# Patient Record
Sex: Male | Born: 1960
Health system: Southern US, Community
[De-identification: ages and names within clinical notes are randomized; demographics above are authoritative.]

## PROBLEM LIST (undated history)

## (undated) DIAGNOSIS — E785 Hyperlipidemia, unspecified: Secondary | ICD-10-CM

## (undated) DIAGNOSIS — F419 Anxiety disorder, unspecified: Secondary | ICD-10-CM

## (undated) DIAGNOSIS — F410 Panic disorder [episodic paroxysmal anxiety] without agoraphobia: Secondary | ICD-10-CM

## (undated) DIAGNOSIS — N529 Male erectile dysfunction, unspecified: Secondary | ICD-10-CM

## (undated) DIAGNOSIS — K219 Gastro-esophageal reflux disease without esophagitis: Secondary | ICD-10-CM

## (undated) DIAGNOSIS — G4700 Insomnia, unspecified: Secondary | ICD-10-CM

## (undated) DIAGNOSIS — F329 Major depressive disorder, single episode, unspecified: Secondary | ICD-10-CM

## (undated) DIAGNOSIS — F32A Depression, unspecified: Secondary | ICD-10-CM

## (undated) DIAGNOSIS — J302 Other seasonal allergic rhinitis: Secondary | ICD-10-CM

## (undated) DIAGNOSIS — I10 Essential (primary) hypertension: Secondary | ICD-10-CM

## (undated) HISTORY — PX: HERNIA REPAIR: SHX51

## (undated) HISTORY — PX: COLONOSCOPY: SHX174

---

## 2011-12-13 DIAGNOSIS — J309 Allergic rhinitis, unspecified: Secondary | ICD-10-CM | POA: Insufficient documentation

## 2011-12-13 DIAGNOSIS — E785 Hyperlipidemia, unspecified: Secondary | ICD-10-CM | POA: Insufficient documentation

## 2011-12-13 DIAGNOSIS — N529 Male erectile dysfunction, unspecified: Secondary | ICD-10-CM | POA: Insufficient documentation

## 2011-12-31 ENCOUNTER — Encounter (HOSPITAL_COMMUNITY): Payer: Self-pay | Admitting: *Deleted

## 2011-12-31 ENCOUNTER — Emergency Department (HOSPITAL_COMMUNITY): Payer: Medicare Other

## 2011-12-31 ENCOUNTER — Emergency Department (HOSPITAL_COMMUNITY)
Admission: EM | Admit: 2011-12-31 | Discharge: 2011-12-31 | Disposition: A | Discharge status: Home / Self Care | Payer: Medicare Other | Attending: Emergency Medicine | Admitting: Emergency Medicine

## 2011-12-31 DIAGNOSIS — R220 Localized swelling, mass and lump, head: Secondary | ICD-10-CM | POA: Insufficient documentation

## 2011-12-31 DIAGNOSIS — H9209 Otalgia, unspecified ear: Secondary | ICD-10-CM | POA: Insufficient documentation

## 2011-12-31 DIAGNOSIS — K089 Disorder of teeth and supporting structures, unspecified: Secondary | ICD-10-CM | POA: Insufficient documentation

## 2011-12-31 DIAGNOSIS — R51 Headache: Secondary | ICD-10-CM | POA: Insufficient documentation

## 2011-12-31 HISTORY — DX: Essential (primary) hypertension: I10

## 2011-12-31 HISTORY — DX: Depression, unspecified: F32.A

## 2011-12-31 HISTORY — DX: Insomnia, unspecified: G47.00

## 2011-12-31 HISTORY — DX: Gastro-esophageal reflux disease without esophagitis: K21.9

## 2011-12-31 HISTORY — DX: Anxiety disorder, unspecified: F41.9

## 2011-12-31 HISTORY — DX: Major depressive disorder, single episode, unspecified: F32.9

## 2011-12-31 HISTORY — DX: Hyperlipidemia, unspecified: E78.5

## 2011-12-31 HISTORY — DX: Panic disorder (episodic paroxysmal anxiety): F41.0

## 2011-12-31 HISTORY — DX: Male erectile dysfunction, unspecified: N52.9

## 2011-12-31 HISTORY — DX: Other seasonal allergic rhinitis: J30.2

## 2011-12-31 MED ORDER — SODIUM CHLORIDE 0.9 % IV SOLN: Freq: Once | INTRAVENOUS | Status: DC

## 2011-12-31 MED ORDER — ONDANSETRON 8 MG PO TBDP
8.0000 mg | ORAL_TABLET | Freq: Once | ORAL | Status: AC
  Administered 2011-12-31: 8 mg via ORAL
  Filled 2011-12-31: qty 1

## 2011-12-31 MED ORDER — OXYCODONE-ACETAMINOPHEN 5-325 MG PO TABS
2.0000 | ORAL_TABLET | Freq: Once | ORAL | Status: AC
  Administered 2011-12-31: 2 via ORAL
  Filled 2011-12-31: qty 2

## 2011-12-31 MED ORDER — OXYCODONE-ACETAMINOPHEN 5-325 MG PO TABS: 1.0000 | ORAL_TABLET | ORAL | Status: AC | PRN

## 2011-12-31 MED ORDER — IBUPROFEN 800 MG PO TABS: 800.0000 mg | ORAL_TABLET | Freq: Three times a day (TID) | ORAL | Status: AC

## 2011-12-31 MED ORDER — IOHEXOL 300 MG/ML  SOLN
100.0000 mL | Freq: Once | INTRAMUSCULAR | Status: AC | PRN
  Administered 2011-12-31: 100 mL via INTRAVENOUS

## 2011-12-31 NOTE — ED Provider Notes (Signed)
History     CSN: [id number]  Arrival date & time 12/31/11  1501   First MD Initiated Contact with Patient 12/31/11 1616      Chief Complaint  Patient presents with  . Facial Swelling  . Otalgia  . Dental Pain    (Consider location/radiation/quality/duration/timing/severity/associated sxs/prior treatment) HPI Comments: [name] [name] 51 y.o. male   The chief complaint is: Patient presents with:   Facial Swelling   Otalgia   Dental Pain     Patient has had ear pain in the right ear for the past few weeks and associated sinu pressure.  He was diagnosed 2 days ago with effusion by his PCP.  Over the past 2 days he had developed acute severe pain in his jaw area anterior to the ear and extending down into the right neck and pain in the temple.   He has not been eating well due to pain.  He He has pain when closing his jaw tightly.  He  Denies tooth pain, sore throat, difficulty breath or swallowing. he rates pain 9/10.  He has no recent history of infection.  He denies heat swelling or redness.  He denies drainage from his cheek.     Patient is a 51 y.o. male presenting with ear pain and tooth pain. The history is provided by the patient and a friend. No language interpreter was used.  Otalgia Associated symptoms include neck pain. Pertinent negatives include no ear discharge, no headaches, no rhinorrhea, no abdominal pain, no diarrhea, no vomiting and no cough.  Dental PainPrimary symptoms do not include headaches, fever, shortness of breath or cough.  Additional symptoms include: facial swelling and ear pain. Additional symptoms do not include: trouble swallowing and fatigue.    Past Medical History  Diagnosis Date  . Hypertension   . Hyperlipidemia   . GERD (gastroesophageal reflux disease)   . Seasonal allergies   . Depression   . Anxiety   . Panic   . Insomnia   . Erectile dysfunction     Past Surgical History  Procedure Date  . Hernia repair     History  reviewed. No pertinent family history.  History  Substance Use Topics  . Smoking status: Never Smoker   . Smokeless tobacco: Never Used  . Alcohol Use: Yes      Review of Systems  Constitutional: Negative for fever, chills and fatigue.  HENT: Positive for ear pain, facial swelling, neck pain and sinus pressure. Negative for congestion, rhinorrhea, mouth sores, trouble swallowing, neck stiffness, dental problem and ear discharge.   Eyes: Negative for pain and discharge.  Respiratory: Negative for cough and shortness of breath.   Cardiovascular: Negative for chest pain.  Gastrointestinal: Negative for nausea, vomiting, abdominal pain and diarrhea.  Musculoskeletal: Negative for myalgias.  Neurological: Negative for headaches.    Allergies  Review of patient's allergies indicates no known allergies.  Home Medications   Current Outpatient Rx  Name Route Sig Dispense Refill  . ALPRAZOLAM 0.25 MG PO TABS Oral Take 0.25 mg by mouth 3 (three) times daily as needed. Anxiety    . CETIRIZINE HCL 10 MG PO TABS Oral Take 10 mg by mouth daily.    Marland Kitchen ESOMEPRAZOLE MAGNESIUM 40 MG PO CPDR Oral Take 40 mg by mouth daily before breakfast.    . FLUTICASONE PROPIONATE 50 MCG/ACT NA SUSP Nasal Place 2 sprays into the nose daily.    . IPRATROPIUM BROMIDE 0.03 % NA SOLN Nasal Place 2 sprays into the  nose every 12 (twelve) hours.    Marland Kitchen LISINOPRIL 10 MG PO TABS Oral Take 10 mg by mouth daily.    . SERTRALINE HCL 50 MG PO TABS Oral Take 50 mg by mouth at bedtime.    Marland Kitchen TADALAFIL 5 MG PO TABS Oral Take 5 mg by mouth daily as needed. ED      BP 128/88  Pulse 73  Temp 98.5 F (36.9 C) (Oral)  Resp 18  SpO2 96%  Physical Exam  Nursing note and vitals reviewed. Constitutional: He is oriented to person, place, and time. He appears well-developed and well-nourished. No distress.  HENT:  Head: Normocephalic and atraumatic. No trismus in the jaw.  Right Ear: External ear normal. No drainage, swelling or  tenderness. No foreign bodies. No mastoid tenderness.  Left Ear: Tympanic membrane and external ear normal.  Mouth/Throat: Uvula is midline and mucous membranes are normal. No oral lesions. No dental abscesses, uvula swelling or dental caries. No oropharyngeal exudate, posterior oropharyngeal edema, posterior oropharyngeal erythema or tonsillar abscesses.       Unable to visualize right TM due to cerumen. Poor dentition. He is not tender to palpation of gums. There is no eveidence of overt carie or pericapical abcess.  Non TTP to buccal mucosa or pterygoid.  There is an area of swelling anterior to the tragus  . Exquisitely ttp a extends down to  The parotid area  And along the medial side of the right SCM. No Head redness or swelling.  Neck: Normal range of motion. No tracheal deviation present.  Cardiovascular: Normal rate, regular rhythm and normal heart sounds.   Pulmonary/Chest: Effort normal and breath sounds normal. No respiratory distress. He exhibits no tenderness.  Abdominal: Soft. Bowel sounds are normal. He exhibits no distension. There is no tenderness.  Lymphadenopathy:    He has no cervical adenopathy.  Neurological: He is alert and oriented to person, place, and time.  Skin: Skin is warm and dry. He is not diaphoretic.  Psychiatric: His behavior is normal.    ED Course  Procedures (including critical care time)  Labs Reviewed - No data to display No results found.   No diagnosis found.  The area of swelling does not appear to be related to dental abcess,m or lympahdenitis.  I do not see evidence or parotits.  I will get a CT of the head for further evaluation. BP 128/88  Pulse 73  Temp 98.5 F (36.9 C) (Oral)  Resp 18  SpO2 96% 5:44 PM  *RADIOLOGY REPORT*  Clinical Data: Right-sided facial pain and swelling.  CT NECK WITH CONTRAST  Technique: Multidetector CT imaging of the neck was performed with  intravenous contrast.  Contrast: OMNIPAQUE IOHEXOL 300  MG/ML SOLN  Comparison: None  Findings: The visualized portion of the brain is unremarkable.  The skull base is unremarkable. The paranasal sinuses and mastoid  air cells are clear. The globes are intact.  The parotid and submandibular glands are normal. The tongue base  and floor of the mouth are normal. The epiglottis is normal and  the paraglottic fat planes are maintained. The piriform sinus and  vallecular air spaces are normal. The region of the true cords is  normal.  The major vascular structures are normal. No lymphadenopathy.  Dental fillings are noted with associated artifact. No obvious  dental or bone abscess. Scattered dental caries.  IMPRESSION:  1. No CT findings for dental abscess. No neck mass, adenopathy or  soft tissue abscess.  2. The paranasal sinuses and mastoid air cells and middle ear  cavities appear clear.   CT is unremarkale.  MDM  Patient is stable.  No signs of ludwigs.  I will d/c with pain control and ENT follow up.       Arthor Captain, PA-C 12/31/11 1956

## 2011-12-31 NOTE — ED Notes (Signed)
Pt states went to his PCP on 7/10 for R ear pain, was told has fluid in ear, since then has worsened, still having ear pain, but now having R jaw pain, pt states he feels like something is running down his ear into his throat. Pt also states he has a bottom tooth loose on R side.

## 2011-12-31 NOTE — ED Notes (Signed)
Pt states on 7/10 went to the doctor d/t R ear pain was told he has fluid in his ear, since then has worsened and pain has moved to R jaw, states he feels like he has a toothache too, has a bottom tooth that is loose. Pt complaining R jaw swollen, denies any respiratory problems. Pt states he feels like something is draining from his ear down his throat. Was not given any prescriptions when went to doctor.

## 2012-01-01 NOTE — ED Provider Notes (Signed)
Medical screening examination/treatment/procedure(s) were performed by non-physician practitioner and as supervising physician I was immediately available for consultation/collaboration.  Toy Baker, MD 01/01/12 [phone]

## 2012-05-10 ENCOUNTER — Emergency Department (HOSPITAL_COMMUNITY): Payer: Medicare Other

## 2012-05-10 ENCOUNTER — Emergency Department (HOSPITAL_COMMUNITY)
Admission: EM | Admit: 2012-05-10 | Discharge: 2012-05-10 | Disposition: A | Discharge status: Home / Self Care | Payer: Medicare Other | Attending: Emergency Medicine | Admitting: Emergency Medicine

## 2012-05-10 ENCOUNTER — Encounter (HOSPITAL_COMMUNITY): Payer: Self-pay | Admitting: Emergency Medicine

## 2012-05-10 DIAGNOSIS — Z79899 Other long term (current) drug therapy: Secondary | ICD-10-CM | POA: Insufficient documentation

## 2012-05-10 DIAGNOSIS — R0789 Other chest pain: Secondary | ICD-10-CM | POA: Insufficient documentation

## 2012-05-10 DIAGNOSIS — R112 Nausea with vomiting, unspecified: Secondary | ICD-10-CM | POA: Insufficient documentation

## 2012-05-10 DIAGNOSIS — K219 Gastro-esophageal reflux disease without esophagitis: Secondary | ICD-10-CM

## 2012-05-10 DIAGNOSIS — F411 Generalized anxiety disorder: Secondary | ICD-10-CM | POA: Insufficient documentation

## 2012-05-10 DIAGNOSIS — I10 Essential (primary) hypertension: Secondary | ICD-10-CM | POA: Insufficient documentation

## 2012-05-10 DIAGNOSIS — G4700 Insomnia, unspecified: Secondary | ICD-10-CM | POA: Insufficient documentation

## 2012-05-10 DIAGNOSIS — R197 Diarrhea, unspecified: Secondary | ICD-10-CM

## 2012-05-10 DIAGNOSIS — F3289 Other specified depressive episodes: Secondary | ICD-10-CM | POA: Insufficient documentation

## 2012-05-10 DIAGNOSIS — E785 Hyperlipidemia, unspecified: Secondary | ICD-10-CM | POA: Insufficient documentation

## 2012-05-10 DIAGNOSIS — F329 Major depressive disorder, single episode, unspecified: Secondary | ICD-10-CM | POA: Insufficient documentation

## 2012-05-10 LAB — CBC WITH DIFFERENTIAL/PLATELET
Hemoglobin: 13.2 g/dL (ref 13.0–17.0)
Lymphs Abs: 1.8 10*3/uL (ref 0.7–4.0)
Monocytes Relative: 4 % (ref 3–12)
Neutro Abs: 4 10*3/uL (ref 1.7–7.7)
Neutrophils Relative %: 65 % (ref 43–77)
RBC: 4.25 MIL/uL (ref 4.22–5.81)

## 2012-05-10 LAB — BASIC METABOLIC PANEL
BUN: 12 mg/dL (ref 6–23)
Chloride: 104 mEq/L (ref 96–112)
GFR calc Af Amer: 76 mL/min — ABNORMAL LOW (ref >90)
Glucose, Bld: 114 mg/dL — ABNORMAL HIGH (ref 70–99)
Potassium: 3.9 mEq/L (ref 3.5–5.1)

## 2012-05-10 LAB — TROPONIN I: Troponin I: <0.3 ng/mL (ref <0.30)

## 2012-05-10 MED ORDER — ONDANSETRON 4 MG PO TBDP: 4.0000 mg | ORAL_TABLET | Freq: Three times a day (TID) | ORAL | Status: DC | PRN

## 2012-05-10 MED ORDER — FAMOTIDINE IN NACL 20-0.9 MG/50ML-% IV SOLN
20.0000 mg | Freq: Once | INTRAVENOUS | Status: AC
  Administered 2012-05-10: 20 mg via INTRAVENOUS
  Filled 2012-05-10: qty 50

## 2012-05-10 MED ORDER — PROMETHAZINE HCL 25 MG PO TABS: 25.0000 mg | ORAL_TABLET | Freq: Four times a day (QID) | ORAL | Status: DC | PRN

## 2012-05-10 MED ORDER — PANTOPRAZOLE SODIUM 40 MG IV SOLR
40.0000 mg | Freq: Once | INTRAVENOUS | Status: AC
  Administered 2012-05-10: 40 mg via INTRAVENOUS
  Filled 2012-05-10: qty 40

## 2012-05-10 MED ORDER — SODIUM CHLORIDE 0.9 % IV SOLN
Freq: Once | INTRAVENOUS | Status: AC
  Administered 2012-05-10: 05:00:00 via INTRAVENOUS

## 2012-05-10 MED ORDER — PROMETHAZINE HCL 25 MG/ML IJ SOLN
25.0000 mg | Freq: Once | INTRAMUSCULAR | Status: AC
  Administered 2012-05-10: 25 mg via INTRAVENOUS
  Filled 2012-05-10: qty 1

## 2012-05-10 MED ORDER — FAMOTIDINE 20 MG PO TABS: 20.0000 mg | ORAL_TABLET | Freq: Two times a day (BID) | ORAL | Status: DC

## 2012-05-10 MED ORDER — ONDANSETRON HCL 4 MG/2ML IJ SOLN
4.0000 mg | Freq: Once | INTRAMUSCULAR | Status: AC
  Administered 2012-05-10: 4 mg via INTRAVENOUS
  Filled 2012-05-10: qty 2

## 2012-05-10 NOTE — ED Notes (Signed)
EKG given to EDP, Miller,MD. 

## 2012-05-10 NOTE — ED Notes (Signed)
Pt c/o chest pain N/V/D/ MD at bedside.

## 2012-05-10 NOTE — ED Notes (Signed)
Pt. On cardiac monitor. 

## 2012-05-10 NOTE — ED Provider Notes (Signed)
History     CSN: [id number]  Arrival date & time 05/10/12  0434   First MD Initiated Contact with Patient 05/10/12 [phone]      Chief Complaint  Patient presents with  . Chest Pain    (Consider location/radiation/quality/duration/timing/severity/associated sxs/prior treatment) HPI Comments: 51 y/o male with hx of GERD / Htn and anxiety presents with c/o n/v/d.  He states that he developed nausea last night at 8 PM, has been vomiting all night and now has pain in his mid chest which is a burning pain that is constant, worse with vomiting and not assocaited with f/c/coug/sob/back pain.  He has no swelling of the legs, dysuria.  He does have diarreha - described as multiple episodes of very loose stools this evening.  Nothing makes better or worsen, no meds pta.  No recent abx, no sick contacts, no travel.  No hx of CAD  Patient is a 51 y.o. male presenting with chest pain. The history is provided by the patient and the spouse.  Chest Pain     Past Medical History  Diagnosis Date  . Hypertension   . Hyperlipidemia   . GERD (gastroesophageal reflux disease)   . Seasonal allergies   . Depression   . Anxiety   . Panic   . Insomnia   . Erectile dysfunction     Past Surgical History  Procedure Date  . Hernia repair     History reviewed. No pertinent family history.  History  Substance Use Topics  . Smoking status: Never Smoker   . Smokeless tobacco: Never Used  . Alcohol Use: Yes     Comment: occ.      Review of Systems  Cardiovascular: Positive for chest pain.  All other systems reviewed and are negative.    Allergies  Review of patient's allergies indicates no known allergies.  Home Medications   Current Outpatient Rx  Name  Route  Sig  Dispense  Refill  . ALPRAZOLAM 0.25 MG PO TABS   Oral   Take 0.25 mg by mouth 3 (three) times daily as needed. Anxiety         . ESOMEPRAZOLE MAGNESIUM 40 MG PO CPDR   Oral   Take 40 mg by mouth daily before  breakfast.         . FLUTICASONE PROPIONATE 50 MCG/ACT NA SUSP   Nasal   Place 2 sprays into the nose daily.         . IPRATROPIUM BROMIDE 0.03 % NA SOLN   Nasal   Place 2 sprays into the nose every 12 (twelve) hours.         Marland Kitchen LISINOPRIL 10 MG PO TABS   Oral   Take 10 mg by mouth daily.         Marland Kitchen TADALAFIL 5 MG PO TABS   Oral   Take 5 mg by mouth daily as needed. ED         . CETIRIZINE HCL 10 MG PO TABS   Oral   Take 10 mg by mouth daily as needed. For seasonal allergies         . FAMOTIDINE 20 MG PO TABS   Oral   Take 1 tablet (20 mg total) by mouth 2 (two) times daily.   30 tablet   0   . ONDANSETRON 4 MG PO TBDP   Oral   Take 1 tablet (4 mg total) by mouth every 8 (eight) hours as needed for nausea.   10 tablet  0   . PROMETHAZINE HCL 25 MG PO TABS   Oral   Take 1 tablet (25 mg total) by mouth every 6 (six) hours as needed for nausea.   12 tablet   0     BP 159/95  Pulse 96  Temp 98.2 F (36.8 C)  Resp 24  SpO2 100%  Physical Exam  Nursing note and vitals reviewed. Constitutional: He appears well-developed and well-nourished. No distress.  HENT:  Head: Normocephalic and atraumatic.  Mouth/Throat: Oropharynx is clear and moist. No oropharyngeal exudate.  Eyes: Conjunctivae normal and EOM are normal. Pupils are equal, round, and reactive to light. Right eye exhibits no discharge. Left eye exhibits no discharge. No scleral icterus.  Neck: Normal range of motion. Neck supple. No JVD present. No thyromegaly present.  Cardiovascular: Normal rate, regular rhythm, normal heart sounds and intact distal pulses.  Exam reveals no gallop and no friction rub.   No murmur heard. Pulmonary/Chest: Effort normal and breath sounds normal. No respiratory distress. He has no wheezes. He has no rales.  Abdominal: Soft. Bowel sounds are normal. He exhibits no distension and no mass. There is no tenderness.  Musculoskeletal: Normal range of motion. He exhibits  no edema and no tenderness.  Lymphadenopathy:    He has no cervical adenopathy.  Neurological: He is alert. Coordination normal.  Skin: Skin is warm and dry. No rash noted. No erythema.  Psychiatric: He has a normal mood and affect. His behavior is normal.    ED Course  Procedures (including critical care time)  Labs Reviewed  CBC WITH DIFFERENTIAL - Abnormal; Notable for the following:    HCT 37.9 (*)     All other components within normal limits  BASIC METABOLIC PANEL - Abnormal; Notable for the following:    Glucose, Bld 114 (*)     GFR calc non Af Amer 65 (*)     GFR calc Af Amer 76 (*)     All other components within normal limits  TROPONIN I   Dg Chest 2 View  05/10/2012  *RADIOLOGY REPORT*  Clinical Data: New onset of cough, congestion and shortness of breath.  Nausea and vomiting.  Mid upper abdominal pain.  CHEST - 2 VIEW  Comparison: None.  Findings: The lungs are well-aerated and clear.  There is no evidence of focal opacification, pleural effusion or pneumothorax.  The heart is normal in size; the mediastinal contour is within normal limits.  No acute osseous abnormalities are seen.  IMPRESSION: No acute cardiopulmonary process seen.   Original Report Authenticated By: Tonia Ghent, M.D.      1. Nausea and vomiting   2. GERD (gastroesophageal reflux disease)   3. Diarrhea       MDM  At this time th ept appears well without any signs of abd ttp, has persistent nausea which will be treated initially as GI source - ECG, labs pending.  Zofran, fluids.   ED ECG REPORT  I personally interpreted this EKG   Date: 05/10/2012   Rate: 94  Rhythm: normal sinus rhythm  QRS Axis: normal  Intervals: normal  ST/T Wave abnormalities: normal  Conduction Disutrbances:none  Narrative Interpretation:   Old EKG Reviewed: none available  Lab work shows normal troponin, normal metabolic panel, normal blood counts and a chest x-ray shows no acute cardiopulmonary process.  I have  personally interpreted the chest x-ray and find her to be no signs of infiltrate, mediastinal abnormalities, soft tissue abnormalities or cardiomegaly.  The EKG  is normal, the patient has almost complete resolution of his symptoms with IV Zofran and Phenergan with IV fluids. He has no chest pain, no difficulty breathing, no abdominal tenderness on repeat exam and normal vital signs including a pulse of 95. I have recommended that the patient return to the hospital should his symptoms worsen, he has expressed his understanding   Discharge Prescriptions include:  Zofran Phenergan Pepcid  Vida Roller, MD 05/10/12 [phone]

## 2013-01-16 ENCOUNTER — Encounter (HOSPITAL_COMMUNITY): Payer: Self-pay | Admitting: Emergency Medicine

## 2013-01-16 ENCOUNTER — Emergency Department (HOSPITAL_COMMUNITY)
Admission: EM | Admit: 2013-01-16 | Discharge: 2013-01-16 | Disposition: A | Discharge status: Home / Self Care | Payer: Medicare Other | Attending: Emergency Medicine | Admitting: Emergency Medicine

## 2013-01-16 ENCOUNTER — Emergency Department (HOSPITAL_COMMUNITY): Payer: Medicare Other

## 2013-01-16 DIAGNOSIS — I10 Essential (primary) hypertension: Secondary | ICD-10-CM | POA: Insufficient documentation

## 2013-01-16 DIAGNOSIS — Z79899 Other long term (current) drug therapy: Secondary | ICD-10-CM | POA: Insufficient documentation

## 2013-01-16 DIAGNOSIS — F410 Panic disorder [episodic paroxysmal anxiety] without agoraphobia: Secondary | ICD-10-CM | POA: Insufficient documentation

## 2013-01-16 DIAGNOSIS — Z8639 Personal history of other endocrine, nutritional and metabolic disease: Secondary | ICD-10-CM | POA: Insufficient documentation

## 2013-01-16 DIAGNOSIS — R1084 Generalized abdominal pain: Secondary | ICD-10-CM | POA: Insufficient documentation

## 2013-01-16 DIAGNOSIS — F3289 Other specified depressive episodes: Secondary | ICD-10-CM | POA: Insufficient documentation

## 2013-01-16 DIAGNOSIS — Z87448 Personal history of other diseases of urinary system: Secondary | ICD-10-CM | POA: Insufficient documentation

## 2013-01-16 DIAGNOSIS — R109 Unspecified abdominal pain: Secondary | ICD-10-CM

## 2013-01-16 DIAGNOSIS — R52 Pain, unspecified: Secondary | ICD-10-CM | POA: Insufficient documentation

## 2013-01-16 DIAGNOSIS — F329 Major depressive disorder, single episode, unspecified: Secondary | ICD-10-CM | POA: Insufficient documentation

## 2013-01-16 DIAGNOSIS — R112 Nausea with vomiting, unspecified: Secondary | ICD-10-CM | POA: Insufficient documentation

## 2013-01-16 DIAGNOSIS — R195 Other fecal abnormalities: Secondary | ICD-10-CM | POA: Insufficient documentation

## 2013-01-16 DIAGNOSIS — K219 Gastro-esophageal reflux disease without esophagitis: Secondary | ICD-10-CM | POA: Insufficient documentation

## 2013-01-16 DIAGNOSIS — Z862 Personal history of diseases of the blood and blood-forming organs and certain disorders involving the immune mechanism: Secondary | ICD-10-CM | POA: Insufficient documentation

## 2013-01-16 LAB — COMPREHENSIVE METABOLIC PANEL
Albumin: 4.3 g/dL (ref 3.5–5.2)
BUN: 11 mg/dL (ref 6–23)
Chloride: 103 mEq/L (ref 96–112)
Creatinine, Ser: 1.04 mg/dL (ref 0.50–1.35)
GFR calc Af Amer: >90 mL/min (ref >90)
GFR calc non Af Amer: 80 mL/min — ABNORMAL LOW (ref >90)
Glucose, Bld: 125 mg/dL — ABNORMAL HIGH (ref 70–99)
Total Bilirubin: 0.8 mg/dL (ref 0.3–1.2)

## 2013-01-16 LAB — URINALYSIS, ROUTINE W REFLEX MICROSCOPIC
Ketones, ur: NEGATIVE mg/dL
Leukocytes, UA: NEGATIVE
Nitrite: NEGATIVE
Protein, ur: NEGATIVE mg/dL
Urobilinogen, UA: 0.2 mg/dL (ref 0.0–1.0)
pH: 6 (ref 5.0–8.0)

## 2013-01-16 LAB — POCT I-STAT TROPONIN I: Troponin i, poc: 0 ng/mL (ref 0.00–0.08)

## 2013-01-16 LAB — CBC WITH DIFFERENTIAL/PLATELET
Basophils Relative: 0 % (ref 0–1)
HCT: 38 % — ABNORMAL LOW (ref 39.0–52.0)
Hemoglobin: 13.4 g/dL (ref 13.0–17.0)
MCH: 32.4 pg (ref 26.0–34.0)
MCHC: 35.3 g/dL (ref 30.0–36.0)
Monocytes Absolute: 0.4 10*3/uL (ref 0.1–1.0)
Monocytes Relative: 6 % (ref 3–12)
Neutro Abs: 3.3 10*3/uL (ref 1.7–7.7)

## 2013-01-16 LAB — LIPASE, BLOOD: Lipase: 29 U/L (ref 11–59)

## 2013-01-16 MED ORDER — PANTOPRAZOLE SODIUM 40 MG IV SOLR
40.0000 mg | Freq: Once | INTRAVENOUS | Status: AC
  Administered 2013-01-16: 40 mg via INTRAVENOUS
  Filled 2013-01-16: qty 40

## 2013-01-16 MED ORDER — ONDANSETRON HCL 4 MG/2ML IJ SOLN
4.0000 mg | Freq: Once | INTRAMUSCULAR | Status: AC
  Administered 2013-01-16: 4 mg via INTRAVENOUS
  Filled 2013-01-16: qty 2

## 2013-01-16 MED ORDER — RANITIDINE HCL 150 MG PO TABS: 150.0000 mg | ORAL_TABLET | Freq: Two times a day (BID) | ORAL | Status: DC

## 2013-01-16 MED ORDER — POTASSIUM CHLORIDE CRYS ER 20 MEQ PO TBCR
40.0000 meq | EXTENDED_RELEASE_TABLET | Freq: Once | ORAL | Status: AC
  Administered 2013-01-16: 40 meq via ORAL
  Filled 2013-01-16: qty 2

## 2013-01-16 MED ORDER — IOHEXOL 300 MG/ML  SOLN
100.0000 mL | Freq: Once | INTRAMUSCULAR | Status: AC | PRN
  Administered 2013-01-16: 100 mL via INTRAVENOUS

## 2013-01-16 MED ORDER — IOHEXOL 300 MG/ML  SOLN
50.0000 mL | Freq: Once | INTRAMUSCULAR | Status: AC | PRN
  Administered 2013-01-16: 50 mL via ORAL

## 2013-01-16 MED ORDER — FENTANYL CITRATE 0.05 MG/ML IJ SOLN
50.0000 ug | Freq: Once | INTRAMUSCULAR | Status: AC
  Administered 2013-01-16: 50 ug via INTRAVENOUS
  Filled 2013-01-16: qty 2

## 2013-01-16 MED ORDER — ONDANSETRON 8 MG PO TBDP
8.0000 mg | ORAL_TABLET | Freq: Once | ORAL | Status: AC
Start: 2013-01-16 — End: 2013-01-16
  Administered 2013-01-16: 8 mg via ORAL
  Filled 2013-01-16: qty 1

## 2013-01-16 MED ORDER — ONDANSETRON 8 MG PO TBDP: 8.0000 mg | ORAL_TABLET | Freq: Three times a day (TID) | ORAL | Status: DC | PRN

## 2013-01-16 NOTE — ED Provider Notes (Signed)
CSN: [id number]     Arrival date & time 01/16/13  0257 History     First MD Initiated Contact with Patient 01/16/13 [phone]     Chief Complaint  Patient presents with  . Abdominal Pain   (Consider location/radiation/quality/duration/timing/severity/associated sxs/prior Treatment) HPI Hx per PT - ABD pain and distention tonight with N/V.  He had some stool with white mucous this evening, pain is all over, mild to MOD, no h/o same, prior hernia repair no other ABD surgeries. No back pain, rash, trauma or fevers.  Past Medical History  Diagnosis Date  . Hypertension   . Hyperlipidemia   . GERD (gastroesophageal reflux disease)   . Seasonal allergies   . Depression   . Anxiety   . Panic   . Insomnia   . Erectile dysfunction    Past Surgical History  Procedure Laterality Date  . Hernia repair     No family history on file. History  Substance Use Topics  . Smoking status: Never Smoker   . Smokeless tobacco: Never Used  . Alcohol Use: Yes     Comment: occ.    Review of Systems  Constitutional: Negative for fever and chills.  HENT: Negative for neck pain and neck stiffness.   Eyes: Negative for pain.  Respiratory: Negative for shortness of breath.   Cardiovascular: Negative for chest pain.  Gastrointestinal: Positive for nausea, vomiting and abdominal pain. Negative for blood in stool.  Genitourinary: Negative for dysuria.  Musculoskeletal: Negative for back pain.  Skin: Negative for rash.  Neurological: Negative for headaches.  All other systems reviewed and are negative.    Allergies  Review of patient's allergies indicates no known allergies.  Home Medications   Current Outpatient Rx  Name  Route  Sig  Dispense  Refill  . ALPRAZolam (XANAX) 0.25 MG tablet   Oral   Take 0.25 mg by mouth 3 (three) times daily as needed. Anxiety         . cetirizine (ZYRTEC) 10 MG tablet   Oral   Take 10 mg by mouth daily as needed. For seasonal allergies         .  esomeprazole (NEXIUM) 40 MG capsule   Oral   Take 40 mg by mouth daily before breakfast.         . famotidine (PEPCID) 20 MG tablet   Oral   Take 1 tablet (20 mg total) by mouth 2 (two) times daily.   30 tablet   0   . fluticasone (FLONASE) 50 MCG/ACT nasal spray   Nasal   Place 2 sprays into the nose daily.         Marland Kitchen ipratropium (ATROVENT) 0.03 % nasal spray   Nasal   Place 2 sprays into the nose every 12 (twelve) hours.         Marland Kitchen lisinopril (PRINIVIL,ZESTRIL) 10 MG tablet   Oral   Take 10 mg by mouth daily.         . ondansetron (ZOFRAN ODT) 4 MG disintegrating tablet   Oral   Take 1 tablet (4 mg total) by mouth every 8 (eight) hours as needed for nausea.   10 tablet   0   . promethazine (PHENERGAN) 25 MG tablet   Oral   Take 1 tablet (25 mg total) by mouth every 6 (six) hours as needed for nausea.   12 tablet   0   . tadalafil (CIALIS) 5 MG tablet   Oral   Take 5 mg by  mouth daily as needed. ED          BP 157/98  Pulse 75  Temp(Src) 98.1 F (36.7 C) (Oral)  Resp 16  Ht 5\' 6"  (1.676 m)  Wt 169 lb (76.658 kg)  BMI 27.29 kg/m2  SpO2 97% Physical Exam  Constitutional: He is oriented to person, place, and time. He appears well-developed and well-nourished.  HENT:  Head: Normocephalic and atraumatic.  Eyes: Conjunctivae and EOM are normal. Pupils are equal, round, and reactive to light. No scleral icterus.  Neck: Trachea normal. Neck supple. No thyromegaly present.  Cardiovascular: Normal rate and regular rhythm.   Pulmonary/Chest: Effort normal and breath sounds normal. No respiratory distress. He has no rhonchi.  Abdominal: Soft. Normal appearance. There is no CVA tenderness and negative Murphy's sign.  diffuse tenderness with mild distention  Musculoskeletal: Normal range of motion. He exhibits no edema.  Neurological: He is alert and oriented to person, place, and time. He has normal strength. No cranial nerve deficit or sensory deficit. GCS eye  subscore is 4. GCS verbal subscore is 5. GCS motor subscore is 6.  Skin: Skin is warm and dry. No rash noted. He is not diaphoretic.  Psychiatric: His speech is normal.  Cooperative and appropriate    ED Course   Procedures (including critical care time)  Results for orders placed during the hospital encounter of 01/16/13  CBC WITH DIFFERENTIAL      Result Value Range   WBC 6.8  4.0 - 10.5 K/uL   RBC 4.13 (*) 4.22 - 5.81 MIL/uL   Hemoglobin 13.4  13.0 - 17.0 g/dL   HCT 66.4 (*) 40.3 - 47.4 %   MCV 92.0  78.0 - 100.0 fL   MCH 32.4  26.0 - 34.0 pg   MCHC 35.3  30.0 - 36.0 g/dL   RDW 25.9  56.3 - 87.5 %   Platelets 240  150 - 400 K/uL   Neutrophils Relative % 49  43 - 77 %   Neutro Abs 3.3  1.7 - 7.7 K/uL   Lymphocytes Relative 44  12 - 46 %   Lymphs Abs 3.0  0.7 - 4.0 K/uL   Monocytes Relative 6  3 - 12 %   Monocytes Absolute 0.4  0.1 - 1.0 K/uL   Eosinophils Relative 1  0 - 5 %   Eosinophils Absolute 0.0  0.0 - 0.7 K/uL   Basophils Relative 0  0 - 1 %   Basophils Absolute 0.0  0.0 - 0.1 K/uL  COMPREHENSIVE METABOLIC PANEL      Result Value Range   Sodium 139  135 - 145 mEq/L   Potassium 3.1 (*) 3.5 - 5.1 mEq/L   Chloride 103  96 - 112 mEq/L   CO2 22  19 - 32 mEq/L   Glucose, Bld 125 (*) 70 - 99 mg/dL   BUN 11  6 - 23 mg/dL   Creatinine, Ser 6.43  0.50 - 1.35 mg/dL   Calcium 8.8  8.4 - 32.9 mg/dL   Total Protein 7.6  6.0 - 8.3 g/dL   Albumin 4.3  3.5 - 5.2 g/dL   AST 25  0 - 37 U/L   ALT 22  0 - 53 U/L   Alkaline Phosphatase 70  39 - 117 U/L   Total Bilirubin 0.8  0.3 - 1.2 mg/dL   GFR calc non Af Amer 80 (*) >90 mL/min   GFR calc Af Amer >90  >90 mL/min  LIPASE, BLOOD  Result Value Range   Lipase 29  11 - 59 U/L  URINALYSIS, ROUTINE W REFLEX MICROSCOPIC      Result Value Range   Color, Urine YELLOW  YELLOW   APPearance CLEAR  CLEAR   Specific Gravity, Urine 1.010  1.005 - 1.030   pH 6.0  5.0 - 8.0   Glucose, UA NEGATIVE  NEGATIVE mg/dL   Hgb urine  dipstick NEGATIVE  NEGATIVE   Bilirubin Urine NEGATIVE  NEGATIVE   Ketones, ur NEGATIVE  NEGATIVE mg/dL   Protein, ur NEGATIVE  NEGATIVE mg/dL   Urobilinogen, UA 0.2  0.0 - 1.0 mg/dL   Nitrite NEGATIVE  NEGATIVE   Leukocytes, UA NEGATIVE  NEGATIVE  POCT I-STAT TROPONIN I      Result Value Range   Troponin i, poc 0.00  0.00 - 0.08 ng/mL   Comment 3               Date: 01/16/2013  Rate: 76  Rhythm: normal sinus rhythm  QRS Axis: normal  Intervals: normal  ST/T Wave abnormalities: nonspecific ST changes  Conduction Disutrbances:none  Narrative Interpretation:   Old EKG Reviewed: unchanged  Protonix, fentanyl  8am - CT pending d/w AN who will review CT scan  MDM  Abd pain/ distention h/o hernia repair ECG, labs IV narcotics and medications provided  CT scan to evaluate   Sunnie Nielsen, MD 01/16/13 1000

## 2013-01-16 NOTE — ED Provider Notes (Addendum)
  Physical Exam  BP 145/85  Pulse 72  Temp(Src) 97.8 F (36.6 C) (Oral)  Resp 20  Ht 5\' 6"  (1.676 m)  Wt 169 lb (76.658 kg)  BMI 27.29 kg/m2  SpO2 96%  Physical Exam  ED Course  Procedures  MDM Pt has hx of hernia repair presents with abd pain, n/v/distention. Concerns for SBO. CT is pending. Labs are WNL. Will follow up.  Filed Vitals:   01/16/13 0624  BP: 145/85  Pulse: 72  Temp: 97.8 F (36.6 C)  Resp: 20      Jayvion Stefanski, MD 01/16/13 0740   8:46 AM CT neg. Exam non peritoneal, but abd is slightly distended. Unsure etiology, could be mild gastro. Return precaution discussed. Will d.c  Derwood Kaplan, MD 01/16/13 [phone]

## 2013-01-16 NOTE — ED Notes (Signed)
Pt c/o loose stools, nausea, pt states he did have midsternal chest discomfort now he feels abdomen is distended. Pt states last BM contained white milky substance. No longer having chest pain.

## 2013-01-24 ENCOUNTER — Encounter (HOSPITAL_COMMUNITY): Payer: Self-pay

## 2013-01-24 ENCOUNTER — Emergency Department (HOSPITAL_COMMUNITY): Payer: Medicare Other

## 2013-01-24 ENCOUNTER — Emergency Department (HOSPITAL_COMMUNITY)
Admission: EM | Admit: 2013-01-24 | Discharge: 2013-01-24 | Disposition: A | Discharge status: Home / Self Care | Payer: Medicare Other | Attending: Emergency Medicine | Admitting: Emergency Medicine

## 2013-01-24 DIAGNOSIS — Z8639 Personal history of other endocrine, nutritional and metabolic disease: Secondary | ICD-10-CM | POA: Insufficient documentation

## 2013-01-24 DIAGNOSIS — K219 Gastro-esophageal reflux disease without esophagitis: Secondary | ICD-10-CM | POA: Insufficient documentation

## 2013-01-24 DIAGNOSIS — R112 Nausea with vomiting, unspecified: Secondary | ICD-10-CM | POA: Insufficient documentation

## 2013-01-24 DIAGNOSIS — G8929 Other chronic pain: Secondary | ICD-10-CM | POA: Insufficient documentation

## 2013-01-24 DIAGNOSIS — Z87448 Personal history of other diseases of urinary system: Secondary | ICD-10-CM | POA: Insufficient documentation

## 2013-01-24 DIAGNOSIS — I10 Essential (primary) hypertension: Secondary | ICD-10-CM | POA: Insufficient documentation

## 2013-01-24 DIAGNOSIS — Z8659 Personal history of other mental and behavioral disorders: Secondary | ICD-10-CM | POA: Insufficient documentation

## 2013-01-24 DIAGNOSIS — R109 Unspecified abdominal pain: Secondary | ICD-10-CM | POA: Insufficient documentation

## 2013-01-24 DIAGNOSIS — Z862 Personal history of diseases of the blood and blood-forming organs and certain disorders involving the immune mechanism: Secondary | ICD-10-CM | POA: Insufficient documentation

## 2013-01-24 LAB — COMPREHENSIVE METABOLIC PANEL
Albumin: 4.2 g/dL (ref 3.5–5.2)
Alkaline Phosphatase: 70 U/L (ref 39–117)
BUN: 9 mg/dL (ref 6–23)
Potassium: 3.6 mEq/L (ref 3.5–5.1)
Total Protein: 7.6 g/dL (ref 6.0–8.3)

## 2013-01-24 LAB — CBC WITH DIFFERENTIAL/PLATELET
Basophils Relative: 0 % (ref 0–1)
Eosinophils Absolute: 0 10*3/uL (ref 0.0–0.7)
Hemoglobin: 13.7 g/dL (ref 13.0–17.0)
MCH: 31.9 pg (ref 26.0–34.0)
MCHC: 34.6 g/dL (ref 30.0–36.0)
Monocytes Relative: 6 % (ref 3–12)
Neutrophils Relative %: 59 % (ref 43–77)
Platelets: 264 10*3/uL (ref 150–400)
RDW: 12.6 % (ref 11.5–15.5)

## 2013-01-24 LAB — LIPASE, BLOOD: Lipase: 30 U/L (ref 11–59)

## 2013-01-24 MED ORDER — ONDANSETRON HCL 4 MG PO TABS: 4.0000 mg | ORAL_TABLET | Freq: Three times a day (TID) | ORAL | Status: DC | PRN

## 2013-01-24 MED ORDER — GI COCKTAIL ~~LOC~~
30.0000 mL | Freq: Once | ORAL | Status: AC
  Administered 2013-01-24: 30 mL via ORAL
  Filled 2013-01-24: qty 30

## 2013-01-24 MED ORDER — FAMOTIDINE 20 MG PO TABS
40.0000 mg | ORAL_TABLET | Freq: Once | ORAL | Status: AC
  Administered 2013-01-24: 40 mg via ORAL
  Filled 2013-01-24: qty 2

## 2013-01-24 MED ORDER — ONDANSETRON 8 MG PO TBDP
8.0000 mg | ORAL_TABLET | Freq: Once | ORAL | Status: AC
  Administered 2013-01-24: 8 mg via ORAL
  Filled 2013-01-24: qty 1

## 2013-01-24 NOTE — ED Notes (Signed)
MD at bedside. 

## 2013-01-24 NOTE — ED Notes (Addendum)
Pt, from home, c/o abdominal distention and n/v.  Pain score 9/10.  Pt was seen at [facility] on 8/14 for the same complaint.  Pt did not follow up with PCP.  Pt sts "I've been having these problems, since the 1990s."  Sts symptoms are intermittent.  Vitals are stable.  NAD noted.

## 2013-01-24 NOTE — ED Notes (Signed)
Pt escorted to discharge window. Verbalized understanding discharge instructions. In no acute distress.   

## 2013-01-24 NOTE — ED Notes (Signed)
US at bedside

## 2013-01-24 NOTE — ED Notes (Signed)
Pt reported to MD that he voluntarily stopped taking medications for GERD.

## 2013-01-24 NOTE — ED Provider Notes (Signed)
CSN: [id number]     Arrival date & time 01/24/13  4098 History     First MD Initiated Contact with Patient 01/24/13 0757     Chief Complaint  Patient presents with  . Abdominal Pain  . Emesis    HPI Pt was seen at 0800.  Per pt, c/o gradual onset and persistence of constant acute flair of his chronic upper abd "pain" "since the 1990's," worse over the past several weeks.  Has been associated with several intermittent episodes of N/V.  Describes the abd pain as "bloating." Pt states he "stopped taking my GERD meds a while ago." States his symptoms improve when he takes his GERD meds. Pt was evaluated in the ED last week for same with negative dx testing, and instructed to f/u with GI MD. States he has not f/u with GI MD. Denies diarrhea, no fevers, no back pain, no rash, no CP/SOB, no black or blood in stools or emesis.       Past Medical History  Diagnosis Date  . Hypertension   . Hyperlipidemia   . GERD (gastroesophageal reflux disease)   . Seasonal allergies   . Depression   . Anxiety   . Panic   . Insomnia   . Erectile dysfunction    Past Surgical History  Procedure Laterality Date  . Hernia repair      History  Substance Use Topics  . Smoking status: Never Smoker   . Smokeless tobacco: Never Used  . Alcohol Use: Yes     Comment: occ.    Review of Systems ROS: Statement: All systems negative except as marked or noted in the HPI; Constitutional: Negative for fever and chills. ; ; Eyes: Negative for eye pain, redness and discharge. ; ; ENMT: Negative for ear pain, hoarseness, nasal congestion, sinus pressure and sore throat. ; ; Cardiovascular: Negative for chest pain, palpitations, diaphoresis, dyspnea and peripheral edema. ; ; Respiratory: Negative for cough, wheezing and stridor. ; ; Gastrointestinal: +abd pain, N/V. Negative for diarrhea, blood in stool, hematemesis, jaundice and rectal bleeding. . ; ; Genitourinary: Negative for dysuria, flank pain and hematuria. ; ;  Musculoskeletal: Negative for back pain and neck pain. Negative for swelling and trauma.; ; Skin: Negative for pruritus, rash, abrasions, blisters, bruising and skin lesion.; ; Neuro: Negative for headache, lightheadedness and neck stiffness. Negative for weakness, altered level of consciousness , altered mental status, extremity weakness, paresthesias, involuntary movement, seizure and syncope.       Allergies  Review of patient's allergies indicates no known allergies.  Home Medications   Current Outpatient Rx  Name  Route  Sig  Dispense  Refill  . esomeprazole (NEXIUM) 40 MG capsule   Oral   Take 40 mg by mouth daily before breakfast.         . fluticasone (FLONASE) 50 MCG/ACT nasal spray   Nasal   Place 2 sprays into the nose daily.         Marland Kitchen lisinopril (PRINIVIL,ZESTRIL) 10 MG tablet   Oral   Take 10 mg by mouth daily.         . ondansetron (ZOFRAN ODT) 8 MG disintegrating tablet   Oral   Take 1 tablet (8 mg total) by mouth every 8 (eight) hours as needed for nausea.   20 tablet   0   . ranitidine (ZANTAC) 150 MG tablet   Oral   Take 1 tablet (150 mg total) by mouth 2 (two) times daily.   60 tablet  0    BP 132/88  Pulse 80  Temp(Src) 98 F (36.7 C) (Oral)  Resp 16  SpO2 97% Physical Exam 0805: Physical examination:  Nursing notes reviewed; Vital signs and O2 SAT reviewed;  Constitutional: Well developed, Well nourished, Well hydrated, In no acute distress; Head:  Normocephalic, atraumatic; Eyes: EOMI, PERRL, No scleral icterus; ENMT: Mouth and pharynx normal, Mucous membranes moist; Neck: Supple, Full range of motion, No lymphadenopathy; Cardiovascular: Regular rate and rhythm, No murmur, rub, or gallop; Respiratory: Breath sounds clear & equal bilaterally, No rales, rhonchi, wheezes.  Speaking full sentences with ease, Normal respiratory effort/excursion; Chest: Nontender, Movement normal; Abdomen: Soft, +mild mid-epigastric and LUQ tenderness to palp. No  rebound or guarding. Nondistended, Normal bowel sounds; Genitourinary: No CVA tenderness; Extremities: Pulses normal, No tenderness, No edema, No calf edema or asymmetry.; Neuro: AA&Ox3, Major CN grossly intact.  Speech clear. No gross focal motor or sensory deficits in extremities.; Skin: Color normal, Warm, Dry.   ED Course   Procedures     MDM  MDM Reviewed: previous chart, nursing note and vitals Reviewed previous: labs and CT scan Interpretation: labs, ultrasound and x-ray   Results for orders placed during the hospital encounter of 01/24/13  CBC WITH DIFFERENTIAL      Result Value Range   WBC 5.8  4.0 - 10.5 K/uL   RBC 4.30  4.22 - 5.81 MIL/uL   Hemoglobin 13.7  13.0 - 17.0 g/dL   HCT 47.8  29.5 - 62.1 %   MCV 92.1  78.0 - 100.0 fL   MCH 31.9  26.0 - 34.0 pg   MCHC 34.6  30.0 - 36.0 g/dL   RDW 30.8  65.7 - 84.6 %   Platelets 264  150 - 400 K/uL   Neutrophils Relative % 59  43 - 77 %   Neutro Abs 3.4  1.7 - 7.7 K/uL   Lymphocytes Relative 35  12 - 46 %   Lymphs Abs 2.0  0.7 - 4.0 K/uL   Monocytes Relative 6  3 - 12 %   Monocytes Absolute 0.4  0.1 - 1.0 K/uL   Eosinophils Relative 1  0 - 5 %   Eosinophils Absolute 0.0  0.0 - 0.7 K/uL   Basophils Relative 0  0 - 1 %   Basophils Absolute 0.0  0.0 - 0.1 K/uL  COMPREHENSIVE METABOLIC PANEL      Result Value Range   Sodium 137  135 - 145 mEq/L   Potassium 3.6  3.5 - 5.1 mEq/L   Chloride 104  96 - 112 mEq/L   CO2 22  19 - 32 mEq/L   Glucose, Bld 113 (*) 70 - 99 mg/dL   BUN 9  6 - 23 mg/dL   Creatinine, Ser 9.62  0.50 - 1.35 mg/dL   Calcium 8.8  8.4 - 95.2 mg/dL   Total Protein 7.6  6.0 - 8.3 g/dL   Albumin 4.2  3.5 - 5.2 g/dL   AST 39 (*) 0 - 37 U/L   ALT 28  0 - 53 U/L   Alkaline Phosphatase 70  39 - 117 U/L   Total Bilirubin 0.8  0.3 - 1.2 mg/dL   GFR calc non Af Amer 79 (*) >90 mL/min   GFR calc Af Amer >90  >90 mL/min  LIPASE, BLOOD      Result Value Range   Lipase 30  11 - 59 U/L   US Abdomen  Complete 01/24/2013   *RADIOLOGY REPORT*  Abdominal ultrasound  History:  Abdominal pain and emesis  Comparison:  CT abdomen and pelvis January 16, 2013  Findings: Gallbladder is visualized in multiple projections.  There are no gallstones, gallbladder wall thickening, or pericholecystic fluid collection.  There is no intrahepatic, common hepatic, or common bile duct dilatation.  Pancreas appears normal.  No focal liver lesions are identified.  Spleen is normal in size and homogeneous in echotexture.  Kidneys bilaterally appear normal. There is no ascites.  Aorta is nonaneurysmal.  Inferior vena cava appears normal.  Conclusion:  Study within normal limits.   Original Report Authenticated By: Bretta Bang, M.D.   Ct Abdomen Pelvis W Contrast 01/16/2013   *RADIOLOGY REPORT*  Clinical Data: Abdominal pain  CT ABDOMEN AND PELVIS WITH CONTRAST  Technique:  Multidetector CT imaging of the abdomen and pelvis was performed following the standard protocol during bolus administration of intravenous contrast.  Contrast: 50mL OMNIPAQUE IOHEXOL 300 MG/ML  SOLN, OMNIPAQUE IOHEXOL 300 MG/ML  SOLN  Comparison: None.  Findings: Visualized lung bases appear normal.  No focal abnormality is seen involving the liver, spleen or pancreas.  No gallstones are noted.  Adrenal glands and kidneys appear normal. The appendix appears normal.  No evidence of bowel obstruction is noted.  Urinary bladder appears normal.  No hydronephrosis or renal obstruction is noted.  No abnormal fluid collection or abscess is noted.  No definite adenopathy is noted.  No osseous abnormality is noted.  IMPRESSION: No significant abnormality seen in the abdomen or pelvis.   Original Report Authenticated By: Lupita Raider.,  M.D.   Dg Abd Acute W/chest 01/24/2013   CLINICAL DATA:  52 year old male with abdominal pain and vomiting.  EXAM: ACUTE ABDOMEN SERIES (ABDOMEN 2 VIEW & CHEST 1 VIEW)  COMPARISON:  CT Abdomen and Pelvis 01/16/2013.  FINDINGS:  The lungs are clear. Incidental bilateral nipple shadows. Normal cardiac size and mediastinal contours. Insert trachea no pneumothorax or pneumoperitoneum.  Non obstructed bowel gas pattern. Abdominal and pelvic visceral contours are within normal limits. No acute osseous abnormality identified.  IMPRESSION: Non obstructed bowel gas pattern, no free air.  Negative chest.   Electronically Signed   By: Augusto Gamble   On: 01/24/2013 09:15    0930:  Pt has tol PO well while in the ED without N/V.  No stooling while in the ED.  Abd benign, VSS. Feels better after meds and wants to go home now. Strongly encouraged to f/u with GI MD; verb understanding. Dx and testing d/w pt and family.  Questions answered.  Verb understanding, agreeable to d/c home with outpt f/u.    Laray Anger, DO 01/27/13 [phone]

## 2013-01-24 NOTE — ED Notes (Signed)
Unable to draw labs on the patient because he getting a ultrasound of the abdomen.

## 2013-04-17 ENCOUNTER — Ambulatory Visit: Payer: Medicare Other | Admitting: Psychiatry

## 2013-04-30 ENCOUNTER — Ambulatory Visit: Payer: Medicare Other | Admitting: Psychiatry

## 2013-12-19 DIAGNOSIS — F339 Major depressive disorder, recurrent, unspecified: Secondary | ICD-10-CM | POA: Insufficient documentation

## 2014-07-14 ENCOUNTER — Emergency Department (HOSPITAL_BASED_OUTPATIENT_CLINIC_OR_DEPARTMENT_OTHER): Payer: Medicare Other

## 2014-07-14 ENCOUNTER — Emergency Department (HOSPITAL_BASED_OUTPATIENT_CLINIC_OR_DEPARTMENT_OTHER)
Admission: EM | Admit: 2014-07-14 | Discharge: 2014-07-14 | Disposition: A | Discharge status: Home / Self Care | Payer: Medicare Other | Attending: Emergency Medicine | Admitting: Emergency Medicine

## 2014-07-14 ENCOUNTER — Encounter (HOSPITAL_BASED_OUTPATIENT_CLINIC_OR_DEPARTMENT_OTHER): Payer: Self-pay

## 2014-07-14 DIAGNOSIS — I10 Essential (primary) hypertension: Secondary | ICD-10-CM | POA: Diagnosis not present

## 2014-07-14 DIAGNOSIS — Z9889 Other specified postprocedural states: Secondary | ICD-10-CM | POA: Diagnosis not present

## 2014-07-14 DIAGNOSIS — Z79899 Other long term (current) drug therapy: Secondary | ICD-10-CM | POA: Diagnosis not present

## 2014-07-14 DIAGNOSIS — Z8659 Personal history of other mental and behavioral disorders: Secondary | ICD-10-CM | POA: Diagnosis not present

## 2014-07-14 DIAGNOSIS — K219 Gastro-esophageal reflux disease without esophagitis: Secondary | ICD-10-CM | POA: Diagnosis not present

## 2014-07-14 DIAGNOSIS — Z87438 Personal history of other diseases of male genital organs: Secondary | ICD-10-CM | POA: Diagnosis not present

## 2014-07-14 DIAGNOSIS — Z8639 Personal history of other endocrine, nutritional and metabolic disease: Secondary | ICD-10-CM | POA: Insufficient documentation

## 2014-07-14 DIAGNOSIS — R079 Chest pain, unspecified: Secondary | ICD-10-CM | POA: Diagnosis present

## 2014-07-14 DIAGNOSIS — R61 Generalized hyperhidrosis: Secondary | ICD-10-CM | POA: Insufficient documentation

## 2014-07-14 DIAGNOSIS — Z8669 Personal history of other diseases of the nervous system and sense organs: Secondary | ICD-10-CM | POA: Insufficient documentation

## 2014-07-14 LAB — CBC WITH DIFFERENTIAL/PLATELET
Basophils Absolute: 0 10*3/uL (ref 0.0–0.1)
Basophils Relative: 0 % (ref 0–1)
EOS PCT: 1 % (ref 0–5)
Eosinophils Absolute: 0 10*3/uL (ref 0.0–0.7)
HCT: 40 % (ref 39.0–52.0)
Hemoglobin: 13.4 g/dL (ref 13.0–17.0)
LYMPHS ABS: 3 10*3/uL (ref 0.7–4.0)
LYMPHS PCT: 41 % (ref 12–46)
MCH: 31.8 pg (ref 26.0–34.0)
MCHC: 33.5 g/dL (ref 30.0–36.0)
MCV: 95 fL (ref 78.0–100.0)
MONO ABS: 0.4 10*3/uL (ref 0.1–1.0)
Monocytes Relative: 6 % (ref 3–12)
NEUTROS ABS: 3.8 10*3/uL (ref 1.7–7.7)
Neutrophils Relative %: 52 % (ref 43–77)
Platelets: 228 10*3/uL (ref 150–400)
RBC: 4.21 MIL/uL — AB (ref 4.22–5.81)
RDW: 11.5 % (ref 11.5–15.5)
WBC: 7.2 10*3/uL (ref 4.0–10.5)

## 2014-07-14 LAB — COMPREHENSIVE METABOLIC PANEL
ALK PHOS: 58 U/L (ref 39–117)
ALT: 19 U/L (ref 0–53)
ANION GAP: 4 — AB (ref 5–15)
AST: 19 U/L (ref 0–37)
Albumin: 4.7 g/dL (ref 3.5–5.2)
BUN: 10 mg/dL (ref 6–23)
CALCIUM: 8.7 mg/dL (ref 8.4–10.5)
CO2: 23 mmol/L (ref 19–32)
Chloride: 110 mmol/L (ref 96–112)
Creatinine, Ser: 1.01 mg/dL (ref 0.50–1.35)
GFR calc non Af Amer: 82 mL/min — ABNORMAL LOW (ref >90)
Glucose, Bld: 104 mg/dL — ABNORMAL HIGH (ref 70–99)
POTASSIUM: 3.5 mmol/L (ref 3.5–5.1)
SODIUM: 137 mmol/L (ref 135–145)
Total Bilirubin: 0.7 mg/dL (ref 0.3–1.2)
Total Protein: 8 g/dL (ref 6.0–8.3)

## 2014-07-14 LAB — TROPONIN I

## 2014-07-14 LAB — LIPASE, BLOOD: LIPASE: 26 U/L (ref 11–59)

## 2014-07-14 MED ORDER — FAMOTIDINE 20 MG PO TABS
40.0000 mg | ORAL_TABLET | Freq: Once | ORAL | Status: AC
  Administered 2014-07-14: 40 mg via ORAL
  Filled 2014-07-14: qty 2

## 2014-07-14 MED ORDER — GI COCKTAIL ~~LOC~~
30.0000 mL | Freq: Once | ORAL | Status: AC
  Administered 2014-07-14: 30 mL via ORAL
  Filled 2014-07-14: qty 30

## 2014-07-14 MED ORDER — PANTOPRAZOLE SODIUM 20 MG PO TBEC: 20.0000 mg | DELAYED_RELEASE_TABLET | Freq: Every day | ORAL | Status: DC

## 2014-07-14 MED ORDER — SODIUM CHLORIDE 0.9 % IV SOLN
INTRAVENOUS | Status: DC
Start: 2014-07-14 — End: 2014-07-15

## 2014-07-14 MED ORDER — SUCRALFATE 1 G PO TABS: 1.0000 g | ORAL_TABLET | Freq: Four times a day (QID) | ORAL | Status: DC

## 2014-07-14 NOTE — ED Provider Notes (Signed)
CSN: [id number]     Arrival date & time 07/14/14  1920 History  This chart was scribed for [name] Jacobsen, MD by Evelene Croon, ED Scribe. This patient was seen in room MH11/MH11 and the patient's care was started 7:38 PM.    Chief Complaint  Patient presents with  . Chest Pain      The history is provided by the patient. No language interpreter was used.     HPI Comments:  [name] is a 54 y.o. male who presents to the Emergency Department complaining of moderate non-radiating epigastric abdominal pain for 3 days. He states his pain had been intermittent with each episode lasting for a few hours until today when his pain has been constant. He notes his pain is exacerbated after eating fried foods in particular and reports increased belching that also exacerbates his pain. He has taken Prilosec without relief. He reports associated mild tingling in his left hand and night sweats with an episode of pain. He denies SOB, CP, vomiting, diarrhea and fever. He denies smoking history and admits to occasional alcohol use.    Past Medical History  Diagnosis Date  . Hypertension   . Hyperlipidemia   . GERD (gastroesophageal reflux disease)   . Seasonal allergies   . Depression   . Anxiety   . Panic   . Insomnia   . Erectile dysfunction    Past Surgical History  Procedure Laterality Date  . Hernia repair     No family history on file. History  Substance Use Topics  . Smoking status: Never Smoker   . Smokeless tobacco: Never Used  . Alcohol Use: Yes    Review of Systems  Constitutional: Positive for diaphoresis (Night Sweats ). Negative for fever and chills.  Respiratory: Negative for shortness of breath.   Cardiovascular: Negative for chest pain.  Gastrointestinal: Positive for abdominal pain. Negative for vomiting and diarrhea.      Allergies  Review of patient's allergies indicates no known allergies.  Home Medications   Prior to Admission medications    Medication Sig Start Date End Date Taking? Authorizing Provider  lisinopril (PRINIVIL,ZESTRIL) 10 MG tablet Take 10 mg by mouth daily.    Historical Provider, MD   BP 153/85 mmHg  Pulse 65  Temp(Src) 98.3 F (36.8 C) (Oral)  Resp 18  Ht 5\' 5"  (1.651 m)  Wt 160 lb (72.576 kg)  BMI 26.63 kg/m2  SpO2 98% Physical Exam  Constitutional: He is oriented to person, place, and time. He appears well-developed and well-nourished.  Non-toxic appearance. No distress.  HENT:  Head: Normocephalic and atraumatic.  Eyes: Conjunctivae, EOM and lids are normal. Pupils are equal, round, and reactive to light.  Neck: Normal range of motion. Neck supple. No tracheal deviation present. No thyroid mass present.  Cardiovascular: Normal rate, regular rhythm and normal heart sounds.  Exam reveals no gallop.   No murmur heard. Pulmonary/Chest: Effort normal and breath sounds normal. No stridor. No respiratory distress. He has no decreased breath sounds. He has no wheezes. He has no rhonchi. He has no rales.  Abdominal: Soft. Normal appearance and bowel sounds are normal. He exhibits no distension. There is tenderness (Epigastric). There is no rebound, no guarding and no CVA tenderness.  Musculoskeletal: Normal range of motion. He exhibits no edema or tenderness.  Neurological: He is alert and oriented to person, place, and time. He has normal strength. No cranial nerve deficit or sensory deficit. GCS eye subscore is 4. GCS verbal  subscore is 5. GCS motor subscore is 6.  Skin: Skin is warm and dry. No abrasion and no rash noted.  Psychiatric: He has a normal mood and affect. His speech is normal and behavior is normal.  Nursing note and vitals reviewed.   ED Course  Procedures   DIAGNOSTIC STUDIES:  Oxygen Saturation is 98% on RA, normal by my interpretation.    COORDINATION OF CARE:  7:50 PM Will order GI cocktail and CXR. Discussed treatment plan with pt at bedside and pt agreed to plan.  Labs  Review Labs Reviewed - No data to display  Imaging Review No results found.   EKG Interpretation None        Date: 07/14/2014  Rate: 62  Rhythm: normal sinus rhythm  QRS Axis: normal  Intervals: normal  ST/T Wave abnormalities: normal  Conduction Disutrbances:none  Narrative Interpretation:   Old EKG Reviewed: none available   MDM   Final diagnoses:  None    I personally performed the services described in this documentation, which was scribed in my presence. The recorded information has been reviewed and is accurate.  9:17 PM Patient given medications for reflux and feels better. No concern for ACS or PE. Patient does have a history of GERD and this felt similar will place him back on his meds    [name] Jacobsen, MD 07/14/14 2118

## 2014-07-14 NOTE — Discharge Instructions (Signed)
Chest Pain (Nonspecific) °It is often hard to give a specific diagnosis for the cause of chest pain. There is always a chance that your pain could be related to something serious, such as a heart attack or a blood clot in the lungs. You need to follow up with your health care provider for further evaluation. °CAUSES  °· Heartburn. °· Pneumonia or bronchitis. °· Anxiety or stress. °· Inflammation around your heart (pericarditis) or lung (pleuritis or pleurisy). °· A blood clot in the lung. °· A collapsed lung (pneumothorax). It can develop suddenly on its own (spontaneous pneumothorax) or from trauma to the chest. °· Shingles infection (herpes zoster virus). °The chest wall is composed of bones, muscles, and cartilage. Any of these can be the source of the pain. °· The bones can be bruised by injury. °· The muscles or cartilage can be strained by coughing or overwork. °· The cartilage can be affected by inflammation and become sore (costochondritis). °DIAGNOSIS  °Lab tests or other studies may be needed to find the cause of your pain. Your health care provider may have you take a test called an ambulatory electrocardiogram (ECG). An ECG records your heartbeat patterns over a 24-hour period. You may also have other tests, such as: °· Transthoracic echocardiogram (TTE). During echocardiography, sound waves are used to evaluate how blood flows through your heart. °· Transesophageal echocardiogram (TEE). °· Cardiac monitoring. This allows your health care provider to monitor your heart rate and rhythm in real time. °· Holter monitor. This is a portable device that records your heartbeat and can help diagnose heart arrhythmias. It allows your health care provider to track your heart activity for several days, if needed. °· Stress tests by exercise or by giving medicine that makes the heart beat faster. °TREATMENT  °· Treatment depends on what may be causing your chest pain. Treatment may include: °· Acid blockers for  heartburn. °· Anti-inflammatory medicine. °· Pain medicine for inflammatory conditions. °· Antibiotics if an infection is present. °· You may be advised to change lifestyle habits. This includes stopping smoking and avoiding alcohol, caffeine, and chocolate. °· You may be advised to keep your head raised (elevated) when sleeping. This reduces the chance of acid going backward from your stomach into your esophagus. °Most of the time, nonspecific chest pain will improve within 2-3 days with rest and mild pain medicine.  °HOME CARE INSTRUCTIONS  °· If antibiotics were prescribed, take them as directed. Finish them even if you start to feel better. °· For the next few days, avoid physical activities that bring on chest pain. Continue physical activities as directed. °· Do not use any tobacco products, including cigarettes, chewing tobacco, or electronic cigarettes. °· Avoid drinking alcohol. °· Only take medicine as directed by your health care provider. °· Follow your health care provider's suggestions for further testing if your chest pain does not go away. °· Keep any follow-up appointments you made. If you do not go to an appointment, you could develop lasting (chronic) problems with pain. If there is any problem keeping an appointment, call to reschedule. °SEEK MEDICAL CARE IF:  °· Your chest pain does not go away, even after treatment. °· You have a rash with blisters on your chest. °· You have a fever. °SEEK IMMEDIATE MEDICAL CARE IF:  °· You have increased chest pain or pain that spreads to your arm, neck, jaw, back, or abdomen. °· You have shortness of breath. °· You have an increasing cough, or you cough   up blood. °· You have severe back or abdominal pain. °· You feel nauseous or vomit. °· You have severe weakness. °· You faint. °· You have chills. °This is an emergency. Do not wait to see if the pain will go away. Get medical help at once. Call your local emergency services (911 in [place] Do not drive  yourself to the hospital. °MAKE SURE YOU:  °· Understand these instructions. °· Will watch your condition. °· Will get help right away if you are not doing well or get worse. °Document Released: 03/01/2005 Document Revised: 05/27/2013 Document Reviewed: 12/26/2007 °Exit[employer]® Patient Information ©2015 [employer]. This information is not intended to replace advice given to you by your health care provider. Make sure you discuss any questions you have with your health care provider. °Gastroesophageal Reflux Disease, Adult °Gastroesophageal reflux disease (GERD) happens when acid from your stomach flows up into the esophagus. When acid comes in contact with the esophagus, the acid causes soreness (inflammation) in the esophagus. Over time, GERD may create small holes (ulcers) in the lining of the esophagus. °CAUSES  °· Increased body weight. This puts pressure on the stomach, making acid rise from the stomach into the esophagus. °· Smoking. This increases acid production in the stomach. °· Drinking alcohol. This causes decreased pressure in the lower esophageal sphincter (valve or ring of muscle between the esophagus and stomach), allowing acid from the stomach into the esophagus. °· Late evening meals and a full stomach. This increases pressure and acid production in the stomach. °· A malformed lower esophageal sphincter. °Sometimes, no cause is found. °SYMPTOMS  °· Burning pain in the lower part of the mid-chest behind the breastbone and in the mid-stomach area. This may occur twice a week or more often. °· Trouble swallowing. °· Sore throat. °· Dry cough. °· Asthma-like symptoms including chest tightness, shortness of breath, or wheezing. °DIAGNOSIS  °Your caregiver may be able to diagnose GERD based on your symptoms. In some cases, X-rays and other tests may be done to check for complications or to check the condition of your stomach and esophagus. °TREATMENT  °Your caregiver may recommend over-the-counter or  prescription medicines to help decrease acid production. Ask your caregiver before starting or adding any new medicines.  °HOME CARE INSTRUCTIONS  °· Change the factors that you can control. Ask your caregiver for guidance concerning weight loss, quitting smoking, and alcohol consumption. °· Avoid foods and drinks that make your symptoms worse, such as: °¨ Caffeine or alcoholic drinks. °¨ Chocolate. °¨ Peppermint or mint flavorings. °¨ Garlic and onions. °¨ Spicy foods. °¨ Citrus fruits, such as oranges, lemons, or limes. °¨ Tomato-based foods such as sauce, chili, salsa, and pizza. °¨ Fried and fatty foods. °· Avoid lying down for the 3 hours prior to your bedtime or prior to taking a nap. °· Eat small, frequent meals instead of large meals. °· Wear loose-fitting clothing. Do not wear anything tight around your waist that causes pressure on your stomach. °· Raise the head of your bed 6 to 8 inches with wood blocks to help you sleep. Extra pillows will not help. °· Only take over-the-counter or prescription medicines for pain, discomfort, or fever as directed by your caregiver. °· Do not take aspirin, ibuprofen, or other nonsteroidal anti-inflammatory drugs (NSAIDs). °SEEK IMMEDIATE MEDICAL CARE IF:  °· You have pain in your arms, neck, jaw, teeth, or back. °· Your pain increases or changes in intensity or duration. °· You develop nausea, vomiting, or sweating (diaphoresis). °·   You develop shortness of breath, or you faint. °· Your vomit is green, yellow, black, or looks like coffee grounds or blood. °· Your stool is red, bloody, or black. °These symptoms could be signs of other problems, such as heart disease, gastric bleeding, or esophageal bleeding. °MAKE SURE YOU:  °· Understand these instructions. °· Will watch your condition. °· Will get help right away if you are not doing well or get worse. °Document Released: 03/01/2005 Document Revised: 08/14/2011 Document Reviewed: 12/09/2010 °ExitCare® Patient  Information ©2015 [employer]. This information is not intended to replace advice given to you by your health care provider. Make sure you discuss any questions you have with your health care provider. ° °

## 2014-07-14 NOTE — ED Notes (Signed)
CP x 3 days

## 2014-07-25 ENCOUNTER — Emergency Department (HOSPITAL_COMMUNITY)
Admission: EM | Admit: 2014-07-25 | Discharge: 2014-07-25 | Disposition: A | Discharge status: Home / Self Care | Payer: Medicare Other | Attending: Emergency Medicine | Admitting: Emergency Medicine

## 2014-07-25 ENCOUNTER — Encounter (HOSPITAL_COMMUNITY): Payer: Self-pay | Admitting: Emergency Medicine

## 2014-07-25 ENCOUNTER — Emergency Department (HOSPITAL_COMMUNITY): Payer: Medicare Other

## 2014-07-25 DIAGNOSIS — R079 Chest pain, unspecified: Secondary | ICD-10-CM

## 2014-07-25 DIAGNOSIS — K219 Gastro-esophageal reflux disease without esophagitis: Secondary | ICD-10-CM

## 2014-07-25 DIAGNOSIS — K2100 Gastro-esophageal reflux disease with esophagitis, without bleeding: Secondary | ICD-10-CM

## 2014-07-25 DIAGNOSIS — Z8669 Personal history of other diseases of the nervous system and sense organs: Secondary | ICD-10-CM | POA: Insufficient documentation

## 2014-07-25 DIAGNOSIS — Z87448 Personal history of other diseases of urinary system: Secondary | ICD-10-CM | POA: Diagnosis not present

## 2014-07-25 DIAGNOSIS — K279 Peptic ulcer, site unspecified, unspecified as acute or chronic, without hemorrhage or perforation: Secondary | ICD-10-CM | POA: Diagnosis not present

## 2014-07-25 DIAGNOSIS — Z8639 Personal history of other endocrine, nutritional and metabolic disease: Secondary | ICD-10-CM | POA: Insufficient documentation

## 2014-07-25 DIAGNOSIS — Z79899 Other long term (current) drug therapy: Secondary | ICD-10-CM | POA: Insufficient documentation

## 2014-07-25 DIAGNOSIS — Z8659 Personal history of other mental and behavioral disorders: Secondary | ICD-10-CM | POA: Diagnosis not present

## 2014-07-25 DIAGNOSIS — I10 Essential (primary) hypertension: Secondary | ICD-10-CM | POA: Insufficient documentation

## 2014-07-25 LAB — TROPONIN I

## 2014-07-25 LAB — BASIC METABOLIC PANEL
Anion gap: 8 (ref 5–15)
BUN: 8 mg/dL (ref 6–23)
CALCIUM: 9 mg/dL (ref 8.4–10.5)
CO2: 23 mmol/L (ref 19–32)
CREATININE: 1.06 mg/dL (ref 0.50–1.35)
Chloride: 110 mmol/L (ref 96–112)
GFR calc non Af Amer: 77 mL/min — ABNORMAL LOW (ref >90)
GFR, EST AFRICAN AMERICAN: 89 mL/min — AB (ref >90)
GLUCOSE: 112 mg/dL — AB (ref 70–99)
Potassium: 3.6 mmol/L (ref 3.5–5.1)
SODIUM: 141 mmol/L (ref 135–145)

## 2014-07-25 LAB — CBC
HCT: 40.7 % (ref 39.0–52.0)
HEMOGLOBIN: 13.7 g/dL (ref 13.0–17.0)
MCH: 31.9 pg (ref 26.0–34.0)
MCHC: 33.7 g/dL (ref 30.0–36.0)
MCV: 94.9 fL (ref 78.0–100.0)
Platelets: 266 10*3/uL (ref 150–400)
RBC: 4.29 MIL/uL (ref 4.22–5.81)
RDW: 12 % (ref 11.5–15.5)
WBC: 7.3 10*3/uL (ref 4.0–10.5)

## 2014-07-25 MED ORDER — PANTOPRAZOLE SODIUM 40 MG IV SOLR
40.0000 mg | Freq: Once | INTRAVENOUS | Status: AC
  Administered 2014-07-25: 40 mg via INTRAVENOUS
  Filled 2014-07-25: qty 40

## 2014-07-25 MED ORDER — AMOXICILLIN 500 MG PO CAPS: 1000.0000 mg | ORAL_CAPSULE | Freq: Two times a day (BID) | ORAL | Status: DC

## 2014-07-25 MED ORDER — SUCRALFATE 1 G PO TABS: 1.0000 g | ORAL_TABLET | Freq: Four times a day (QID) | ORAL | Status: DC

## 2014-07-25 MED ORDER — CLARITHROMYCIN 500 MG PO TABS: 500.0000 mg | ORAL_TABLET | Freq: Two times a day (BID) | ORAL | Status: DC

## 2014-07-25 MED ORDER — PANTOPRAZOLE SODIUM 20 MG PO TBEC: 20.0000 mg | DELAYED_RELEASE_TABLET | Freq: Every day | ORAL | Status: DC

## 2014-07-25 MED ORDER — SODIUM CHLORIDE 0.9 % IV BOLUS (SEPSIS)
1000.0000 mL | Freq: Once | INTRAVENOUS | Status: AC
  Administered 2014-07-25: 1000 mL via INTRAVENOUS

## 2014-07-25 MED ORDER — GI COCKTAIL ~~LOC~~
30.0000 mL | Freq: Once | ORAL | Status: AC
  Administered 2014-07-25: 30 mL via ORAL
  Filled 2014-07-25: qty 30

## 2014-07-25 MED ORDER — HYDROMORPHONE HCL 1 MG/ML IJ SOLN
0.5000 mg | Freq: Once | INTRAMUSCULAR | Status: AC
  Administered 2014-07-25: 0.5 mg via INTRAVENOUS
  Filled 2014-07-25: qty 1

## 2014-07-25 NOTE — ED Notes (Signed)
Delay in lab draw, pt in exray 

## 2014-07-25 NOTE — ED Notes (Addendum)
Pt from home via EMS c/o GERD and rib pain from emesis x 2 months. Pt is A&O and in NAD. EMS 12 lead NSR and unremarkable per EMS personnel. Pt has already been evaluated at bedside by Dr Lenna Sciara

## 2014-07-25 NOTE — ED Provider Notes (Signed)
CSN: [id number]     Arrival date & time 07/25/14  1537 History   First MD Initiated Contact with Patient 07/25/14 1705     Chief Complaint  Patient presents with  . Gastrophageal Reflux  . Chest Pain     (Consider location/radiation/quality/duration/timing/severity/associated sxs/prior Treatment) HPI   Patient he is here with multiple complaints.  [name] [name] is a 54 year old male past and was referred hypertension, hyperlipidemia, GERD who presents the ER today complaining of epigastric and left-sided chest pain which has been intermittent for the past month. Patient states approximately one month ago he ran out of his prescribed Nexium, and since then has been having this intermittent chest discomfort. Patient describes the discomfort as a pinching sensation in his upper chest and a burning sensation in his epigastrium. Patient also reports noticing food reflux into his throat periodically. Patient states he notices the symptoms more immediately after eating, and while lying flat at night. Patient reports some associated shortness of breath, nausea when he gets this discomfort.   Patient also complains of sinus congestion and facial pressure over the past month. He denies sore throat, dysphagia, fever, chills, dyspnea, oral or throat swelling.  Patient also complains of a tingling sensation in his right foot that travels to his right arm, radiates to his hands bilaterally and to his legs bilaterally. Patient denies numbness, weakness, saddle anesthesia, bowel/bladder incontinence/retention, loss of function or sensation.  Past Medical History  Diagnosis Date  . Hypertension   . Hyperlipidemia   . GERD (gastroesophageal reflux disease)   . Seasonal allergies   . Depression   . Anxiety   . Panic   . Insomnia   . Erectile dysfunction    Past Surgical History  Procedure Laterality Date  . Hernia repair     No family history on file. History  Substance Use Topics  . Smoking  status: Never Smoker   . Smokeless tobacco: Never Used  . Alcohol Use: Yes    Review of Systems  Constitutional: Negative for fever.  HENT: Negative for trouble swallowing.   Eyes: Negative for visual disturbance.  Respiratory: Negative for shortness of breath.   Cardiovascular: Negative for chest pain.  Gastrointestinal: Negative for nausea, vomiting and abdominal pain.  Genitourinary: Negative for dysuria.  Musculoskeletal: Negative for neck pain.  Skin: Negative for rash.  Neurological: Negative for dizziness, weakness and numbness.  Psychiatric/Behavioral: Negative.       Allergies  Review of patient's allergies indicates no known allergies.  Home Medications   Prior to Admission medications   Medication Sig Start Date End Date Taking? Authorizing Provider  Chlorphen-PE-Acetaminophen (SINUS CONGESTION/PAIN DAY/NGHT PO) Take 1 tablet by mouth daily as needed (sinus congestion).   Yes Historical Provider, MD  lisinopril (PRINIVIL,ZESTRIL) 10 MG tablet Take 10 mg by mouth daily.   Yes Historical Provider, MD  amoxicillin (AMOXIL) 500 MG capsule Take 2 capsules (1,000 mg total) by mouth 2 (two) times daily. 07/25/14   Carrie Mew, PA-C  clarithromycin (BIAXIN) 500 MG tablet Take 1 tablet (500 mg total) by mouth 2 (two) times daily. 07/25/14   Carrie Mew, PA-C  pantoprazole (PROTONIX) 20 MG tablet Take 1 tablet (20 mg total) by mouth daily. 07/25/14   Carrie Mew, PA-C  sucralfate (CARAFATE) 1 G tablet Take 1 tablet (1 g total) by mouth 4 (four) times daily. 07/25/14   Carrie Mew, PA-C   BP 171/89 mmHg  Pulse 57  Temp(Src) 98 F (36.7 C) (Oral)  Resp 11  SpO2 95% Physical Exam  Constitutional: He is oriented to person, place, and time. He appears well-developed and well-nourished. No distress.  HENT:  Head: Normocephalic and atraumatic.  Mouth/Throat: Oropharynx is clear and moist. No oropharyngeal exudate.  Eyes: Right eye exhibits no discharge. Left eye  exhibits no discharge. No scleral icterus.  Neck: Normal range of motion.  Cardiovascular: Normal rate, regular rhythm and normal heart sounds.   No murmur heard. Pulmonary/Chest: Effort normal and breath sounds normal. No respiratory distress.  Abdominal: Soft. Normal appearance and bowel sounds are normal. There is tenderness in the epigastric area. There is no rigidity, no guarding, no tenderness at McBurney's point and negative Murphy's sign.  Musculoskeletal: Normal range of motion. He exhibits no edema or tenderness.  Neurological: He is alert and oriented to person, place, and time. He has normal strength. No cranial nerve deficit or sensory deficit. He displays a negative Romberg sign. Coordination and gait normal. GCS eye subscore is 4. GCS verbal subscore is 5. GCS motor subscore is 6.  Patient fully alert, answering questions appropriately in full, clear sentences. Cranial nerves II through XII grossly intact. Motor strength 5 out of 5 in all major muscle groups of upper and lower extremity. Distal sensation intact.  Skin: Skin is warm and dry. No rash noted. He is not diaphoretic.  Psychiatric: He has a normal mood and affect.    ED Course  Procedures (including critical care time) Labs Review Labs Reviewed  BASIC METABOLIC PANEL - Abnormal; Notable for the following:    Glucose, Bld 112 (*)    GFR calc non Af Amer 77 (*)    GFR calc Af Amer 89 (*)    All other components within normal limits  CBC  TROPONIN I    Imaging Review Dg Chest 2 View  07/25/2014   CLINICAL DATA:  Pt c/o left sided chest pain that radiates "all around", SOB , cough, congestion, leg swelling x 1 month. Hx of hypertension. No hx of smoking.  EXAM: CHEST  2 VIEW  COMPARISON:  07/14/2014  FINDINGS: Heart, mediastinum and hila are unremarkable.  Small nodule projects in the lower lung zone on the frontal view, not evident on the lateral view. This is most likely a nipple shadow.  Lungs are otherwise clear.   No pleural effusion or pneumothorax.  Bony thorax is unremarkable.  IMPRESSION: No acute cardiopulmonary disease.  Small nodular opacity projecting over the lower lung zone on the left most likely a nipple shadow. Recommend repeat frontal chest radiograph with nipple markers for confirmation.   Electronically Signed   By: Lajean Manes M.D.   On: 07/25/2014 17:11     EKG Interpretation   Date/Time:  Saturday July 25 2014 16:23:46 EST Ventricular Rate:  59 PR Interval:  158 QRS Duration: 93 QT Interval:  400 QTC Calculation: 396 R Axis:   63 Text Interpretation:  Sinus rhythm Consider left ventricular hypertrophy  Anterior ST elevation, probably due to LVH No significant change since  last tracing Confirmed by Winfred Leeds  MD, SAM [id number]) on 07/25/2014 4:27:24  PM      MDM   Final diagnoses:  Gastroesophageal reflux disease with esophagitis  Peptic ulcer disease    Patient's chest discomfort today is consistent with signs and symptoms patient has expressed in the past related to his GERD. Patient states the symptoms have been present since he has ran out of his prescribed Nexium which is approximately one month ago. No concern  for ACS today as patient has a negative troponin, negative cardiac workup here and symptoms have been present greater than 6 hours. Wells criteria negative for PE, no concern for PE. Chest x-ray unremarkable for acute pathology. Patient also describing his symptoms as being worsened with eating, reproducible with palpation to his epigastrium, feeling of food which backs up into his throat, and symptoms worsening when lying flat. The signs and symptoms are suggestive more of a reflux or esophagitis versus ACS. Patient has multiple ongoing issues, regarding some aches and pains as well as sinus congestion, based on patient's symptoms today of GERD, if symptoms are consistent with a possible peptic ulcer disease. We'll place patient on triple therapy for peptic ulcer,  and with these antibiotic treatments, we'll defer treating his possible sinusitis as this seemed to be a secondary complaint, patient stated he is not as concerned about this as he was his chest discomfort and reflux. Also deferring treatment of antibiotics for his secondary complaints to avoid GI upset and worsening of his GERD. Patient's neuro exam is benign, believe patient can follow up as outpatient with a primary care provider regarding some of his chronic pain. I discussed return precautions with patient, and patient verbalizes understanding and agreement with this plan. I encouraged patient to call or return to the ER with any worsening of symptoms or should he have any questions or concerns.  BP 171/89 mmHg  Pulse 57  Temp(Src) 98 F (36.7 C) (Oral)  Resp 11  SpO2 95%  Signed,  Dahlia Bailiff, PA-C 1:46 AM  Patient seen and discussed with Dr. Debby Freiberg, MD  Carrie Mew, PA-C 07/26/14 0223  Debby Freiberg, MD 07/26/14 [phone]

## 2014-07-25 NOTE — ED Notes (Addendum)
Pt reports that he has hx of GERD and today he called EMS due to L sided chest pain x1 month. Pt ran out of his nexium and has not taken it for over 5 months. Pt is A&O, has steady gait and in NAD. At the end of triage pt adds that he has tingling in his R foot that travels to R arm, over to L arm and down to L leg

## 2014-07-25 NOTE — Discharge Instructions (Signed)
Gastroesophageal Reflux Disease, Adult Gastroesophageal reflux disease (GERD) happens when acid from your stomach flows up into the esophagus. When acid comes in contact with the esophagus, the acid causes soreness (inflammation) in the esophagus. Over time, GERD may create small holes (ulcers) in the lining of the esophagus. CAUSES   Increased body weight. This puts pressure on the stomach, making acid rise from the stomach into the esophagus.  Smoking. This increases acid production in the stomach.  Drinking alcohol. This causes decreased pressure in the lower esophageal sphincter (valve or ring of muscle between the esophagus and stomach), allowing acid from the stomach into the esophagus.  Late evening meals and a full stomach. This increases pressure and acid production in the stomach.  A malformed lower esophageal sphincter. Sometimes, no cause is found. SYMPTOMS   Burning pain in the lower part of the mid-chest behind the breastbone and in the mid-stomach area. This may occur twice a week or more often.  Trouble swallowing.  Sore throat.  Dry cough.  Asthma-like symptoms including chest tightness, shortness of breath, or wheezing. DIAGNOSIS  Your caregiver may be able to diagnose GERD based on your symptoms. In some cases, X-rays and other tests may be done to check for complications or to check the condition of your stomach and esophagus. TREATMENT  Your caregiver may recommend over-the-counter or prescription medicines to help decrease acid production. Ask your caregiver before starting or adding any new medicines.  HOME CARE INSTRUCTIONS   Change the factors that you can control. Ask your caregiver for guidance concerning weight loss, quitting smoking, and alcohol consumption.  Avoid foods and drinks that make your symptoms worse, such as:  Caffeine or alcoholic drinks.  Chocolate.  Peppermint or mint flavorings.  Garlic and onions.  Spicy foods.  Citrus  fruits, such as oranges, lemons, or limes.  Tomato-based foods such as sauce, chili, salsa, and pizza.  Fried and fatty foods.  Avoid lying down for the 3 hours prior to your bedtime or prior to taking a nap.  Eat small, frequent meals instead of large meals.  Wear loose-fitting clothing. Do not wear anything tight around your waist that causes pressure on your stomach.  Raise the head of your bed 6 to 8 inches with wood blocks to help you sleep. Extra pillows will not help.  Only take over-the-counter or prescription medicines for pain, discomfort, or fever as directed by your caregiver.  Do not take aspirin, ibuprofen, or other nonsteroidal anti-inflammatory drugs (NSAIDs). SEEK IMMEDIATE MEDICAL CARE IF:   You have pain in your arms, neck, jaw, teeth, or back.  Your pain increases or changes in intensity or duration.  You develop nausea, vomiting, or sweating (diaphoresis).  You develop shortness of breath, or you faint.  Your vomit is green, yellow, black, or looks like coffee grounds or blood.  Your stool is red, bloody, or black. These symptoms could be signs of other problems, such as heart disease, gastric bleeding, or esophageal bleeding. MAKE SURE YOU:   Understand these instructions.  Will watch your condition.  Will get help right away if you are not doing well or get worse. Document Released: 03/01/2005 Document Revised: 08/14/2011 Document Reviewed: 12/09/2010 [facility] Patient Information 2015 [place], [place]. This information is not intended to replace advice given to you by your health care provider. Make sure you discuss any questions you have with your health care provider.   Peptic Ulcer A peptic ulcer is a sore in the lining of your esophagus (  esophageal ulcer), stomach (gastric ulcer), or in the first part of your small intestine (duodenal ulcer). The ulcer causes erosion into the deeper tissue. CAUSES  Normally, the lining of the stomach and the  small intestine protects itself from the acid that digests food. The protective lining can be damaged by:  An infection caused by a bacterium called Helicobacter pylori (H. pylori).  Regular use of nonsteroidal anti-inflammatory drugs (NSAIDs), such as ibuprofen or aspirin.  Smoking tobacco. Other risk factors include being older than 40, drinking alcohol excessively, and having a family history of ulcer disease.  SYMPTOMS   Burning pain or gnawing in the area between the chest and the belly button.  Heartburn.  Nausea and vomiting.  Bloating. The pain can be worse on an empty stomach and at night. If the ulcer results in bleeding, it can cause:  Black, tarry stools.  Vomiting of bright red blood.  Vomiting of coffee-ground-looking materials. DIAGNOSIS  A diagnosis is usually made based upon your history and an exam. Other tests and procedures may be performed to find the cause of the ulcer. Finding a cause will help determine the best treatment. Tests and procedures may include:  Blood tests, stool tests, or breath tests to check for the bacterium H. pylori.  An upper gastrointestinal (GI) series of the esophagus, stomach, and small intestine.  An endoscopy to examine the esophagus, stomach, and small intestine.  A biopsy. TREATMENT  Treatment may include:  Eliminating the cause of the ulcer, such as smoking, NSAIDs, or alcohol.  Medicines to reduce the amount of acid in your digestive tract.  Antibiotic medicines if the ulcer is caused by the H. pylori bacterium.  An upper endoscopy to treat a bleeding ulcer.  Surgery if the bleeding is severe or if the ulcer created a hole somewhere in the digestive system. HOME CARE INSTRUCTIONS   Avoid tobacco, alcohol, and caffeine. Smoking can increase the acid in the stomach, and continued smoking will impair the healing of ulcers.  Avoid foods and drinks that seem to cause discomfort or aggravate your ulcer.  Only take  medicines as directed by your caregiver. Do not substitute over-the-counter medicines for prescription medicines without talking to your caregiver.  Keep any follow-up appointments and tests as directed. SEEK MEDICAL CARE IF:   Your do not improve within 7 days of starting treatment.  You have ongoing indigestion or heartburn. SEEK IMMEDIATE MEDICAL CARE IF:   You have sudden, sharp, or persistent abdominal pain.  You have bloody or dark black, tarry stools.  You vomit blood or vomit that looks like coffee grounds.  You become light-headed, weak, or feel faint.  You become sweaty or clammy. MAKE SURE YOU:   Understand these instructions.  Will watch your condition.  Will get help right away if you are not doing well or get worse. Document Released: 05/19/2000 Document Revised: 10/06/2013 Document Reviewed: 12/20/2011 [facility] Patient Information 2015 [place], [place]. This information is not intended to replace advice given to you by your health care provider. Make sure you discuss any questions you have with your health care provider.  Food Choices for Peptic Ulcer Disease When you have peptic ulcer disease, the foods you eat and your eating habits are very important. Choosing the right foods can help ease the discomfort of peptic ulcer disease. WHAT GENERAL GUIDELINES DO I NEED TO FOLLOW?  Choose fruits, vegetables, whole grains, and low-fat meat, fish, and poultry.   Keep a food diary to identify foods that  cause symptoms.  Avoid foods that cause irritation or pain. These may be different for different people.  Eat frequent small meals instead of three large meals each day. The pain may be worse when your stomach is empty.  Avoid eating close to bedtime. WHAT FOODS ARE NOT RECOMMENDED? The following are some foods and drinks that may worsen your symptoms:  Black, white, and red pepper.  Hot sauce.  Chili peppers.  Chili powder.  Chocolate and cocoa.    Alcohol.  Tea, coffee, and cola (regular and decaffeinated). The items listed above may not be a complete list of foods and beverages to avoid. Contact your dietitian for more information. Document Released: 08/14/2011 Document Revised: 05/27/2013 Document Reviewed: 03/26/2013 [facility] Patient Information 2015 [employer], [place]. This information is not intended to replace advice given to you by your health care provider. Make sure you discuss any questions you have with your health care provider.   Emergency Department Resource Guide 1) Find a Doctor and Pay Out of Pocket Although you won't have to find out who is covered by your insurance plan, it is a good idea to ask around and get recommendations. You will then need to call the office and see if the doctor you have chosen will accept you as a new patient and what types of options they offer for patients who are self-pay. Some doctors offer discounts or will set up payment plans for their patients who do not have insurance, but you will need to ask so you aren't surprised when you get to your appointment.  2) Contact Your Local Health Department Not all health departments have doctors that can see patients for sick visits, but many do, so it is worth a call to see if yours does. If you don't know where your local health department is, you can check in your phone book. The CDC also has a tool to help you locate your state's health department, and many state websites also have listings of all of their local health departments.  3) Find a [facility] If your illness is not likely to be very severe or complicated, you may want to try a walk in clinic. These are popping up all over the country in pharmacies, drugstores, and shopping centers. They're usually staffed by nurse practitioners or physician assistants that have been trained to treat common illnesses and complaints. They're usually fairly quick and inexpensive. However, if you have  serious medical issues or chronic medical problems, these are probably not your best option.  No Primary Care Doctor: - Call Health Connect at  [phone] - they can help you locate a primary care doctor that  accepts your insurance, provides certain services, etc. - Physician Referral Service- [phone]  Chronic Pain Problems: Organization         Address  Phone   Notes  [facility]  [phone] Patients need to be referred by their primary care doctor.   Medication Assistance: Organization         Address  Phone   Notes  [facility] Medication [facility] [address] [place], [place] [address] [phone] --Must be a resident of [facility] -- Must have NO insurance coverage whatsoever (no Medicaid/ Medicare, etc.) -- The pt. MUST have a primary care doctor that directs their care regularly and follows them in the community   MedAssist  [phone]   [employer]  [phone]    Agencies that provide inexpensive  medical care: Organization         Address  Phone   Notes  [place]  [phone]   [facility]    [phone]   [facility] [place], [place] [address] [phone]   [place] [address], [place] [phone]   Planned Parenthood    [phone]   [facility]    [phone]   [place] [address], [place] Phone:  [phone], Fax:  [phone] Hours of Operation:  9 am - 6 pm, M-F.  Also accepts Medicaid/Medicare and self-pay.  [facility] for [place], [address], [place] Phone: [phone], Fax: [phone]. Hours of Operation:  8:30 am - 5:30 pm, M-F.  Also accepts Medicaid and self-pay.  [facility] [place] [address], [place] Phone: [phone]   [place],  [place], [place] [phone], Ext. [phone] Mondays & Thursdays: 7-9 AM.  First 15 patients are seen on a first come, first serve basis.    [place] Providers:  Organization         Address  Phone   Notes  [facility] [address], [place] [phone] Also accepts self-pay patients.  [facility] [address], [place]  [phone]   [place], [address], [place] [phone]   [facility] [address] [place] [phone]   Lucianne Lei [address], [place]   [phone] Only accepts [place] Access [place] patients after they have their name applied to their card.   Self-Pay (no insurance) in [facility]:  Organization         Address  Phone   Notes  Sickle Cell Patients, [facility] Internal Medicine [place] [phone]   [facility] [place] [phone]   [facility] [place]  [place], [place], [place] [phone]   [facility]/Dr. Osei-Bonsu  [address] [place] or [address], [place] [phone] Phone number for both [place] and [place] locations is the same.  Urgent Medical and [facility] [address], [place] [phone]   [facility] [address] [place] or [address] [phone]   [facility] [address] [place] [phone], phone; [phone], fax Sees patients 1st and 3rd Saturday of every month.  Must not qualify for public or private insurance (i.e. Medicaid, Medicare, [place] Health Choice, Veterans' Benefits)  Household income should be no more than 200% of the poverty level The clinic cannot treat you if you are pregnant or think you are pregnant   Sexually transmitted diseases are not treated at the clinic.    Dental Care: Organization         Address  Phone  Notes  [facility] Department of [facility] [place] [phone] Accepts children up to age 67 who are enrolled in [place] or [place]; pregnant women with a Medicaid card; and children who have applied for Medicaid or [place] Health Choice, but  were declined, whose parents can pay a reduced fee at time of service.  [facility] of [facility]  [address], [place] [phone] Accepts children up to age 31 who are enrolled in [place] or [place]; pregnant women with a Medicaid card; and children who have applied for Medicaid or [place] Health Choice, but were declined, whose parents can pay a reduced fee at time of service.  [facility] Adult Dental Access PROGRAM  [place] [phone] Patients are seen by appointment only. Walk-ins are not accepted. [place] will see patients 74 years of age and older. Monday - Tuesday (8am-5pm) Most Wednesdays (8:30-5pm) $30 per visit, cash only  [facility] Adult Dental Access PROGRAM  [address], [facility] [phone] Patients are seen by appointment only. Walk-ins are not accepted. [place] will see patients 57 years of age and older. One Wednesday Evening (Monthly: Volunteer Based).  $30 per visit, cash only  [place]  [phone] for adults; Children under age 28, call Graduate Pediatric Dentistry at [phone]. Children aged 46-14, please call [phone] to request a pediatric application.  Dental services are provided in all areas of dental care including fillings, crowns and bridges, complete and partial dentures, implants, gum treatment, root canals, and extractions. Preventive care is also provided. Treatment is provided to both adults and children. Patients  are selected via a lottery and there is often a waiting list.   [facility] [address] [place]  [phone] [email or web address]   [facility] [address] [place], [place] [phone], Ext. [phone] Second and Fourth Thursday of each month, opens at 6:30 AM; Clinic ends at 9 AM.  Patients are seen on a first-come first-served basis, and a limited number are seen during each clinic.   [facility]  [address] Hillard Danker [place], [place] [phone]   Eligibility Requirements You must have lived in [place], [place], or [place] counties for at least the last three months.   You cannot be eligible for state or federal sponsored [employer], including [employer], [place], or [employer].   You generally cannot be eligible for healthcare insurance through your employer.    How to apply: Eligibility screenings are held every Tuesday and Wednesday afternoon from 1:00 pm until 4:00 pm. You do not need an appointment for the interview!  [facility] [address], [place], [place]   [place]  [place] Department  [place]  [phone]    Behavioral Health Resources in the Community: Intensive Outpatient Programs Organization         Address  Phone  Notes  [place]. [address] [place], [place] [phone]   [facility]patient [address] [place], [place]   ADS: Alcohol [facility] Drug [facility] [address] [place], [place]   [place] [address],  [place], [place] or [phone]   Substance Abuse Resources Organization         Address  Phone  Notes  Alcohol and Drug Services  [phone]   [facility]  [phone]   [facility] [place]  [phone]   Chinita Pester  [phone]    Residential & Outpatient Substance Abuse Program  [phone]   Psychological Services  Organization         Address  Phone  Notes  [place]  [place]  [phone]   [place] [address], [place] or [phone]    Mobile Crisis Teams Organization         Address  Phone  Notes  [facility], Mobile Crisis Care Unit  [phone]   [facility]  [address] [place], [place]   [place] Levels [address] [place] [phone]    Self-Help/Support Groups Organization         Address  Phone             Notes  [place]. of [place] - variety of support groups  [place] Call for more information  Narcotics Anonymous (NA), [facility] [address], [employer]   2 meetings at this location   Special educational needs teacher         Address  Phone  Notes  [facility]AP Residential Treatment [place],    [place]  [phone]   [facility]  [address], [place] [id number], [place], [place]   [place], [place] [phone] Admissions: 8am-3pm M-F  Incentives Substance [place] [address],    [place], [place] [phone]   [facility] [address] [place], [place], [place]   [facility] [address],  [place], [place]   [facility] - Intensive Outpatient [address], [place], [place]   [facility] [place].,  [place], [place] [phone] or [phone]   [facility] (RTS) [address], [place], [place] Accepts Medicaid  [facility] [place] [address],  [place] [phone] Substance Abuse/Addiction Treatment   [facility] Organization         Address  Phone  Notes  [facility]  [phone]   Domenic Schwab, PhD [address] Arlis Port[place], [place]   [phone] or [phone]   [facility] [place], [place] [phone]   [facility] [address] [place], [place] [phone] Insurance/Medicaid/sponsorship through [facility] and Families [address] [place]                                    [place], [place] [phone] [place] [address], [place] [phone]    Dr. Adele Schilder  [phone]   [facility] Dept. 1) [address] [place] 2) [place] 3)  [place] [address], [place] [phone]  [phone]   [place] [phone] or [phone] (After Hours)

## 2014-07-29 ENCOUNTER — Emergency Department (HOSPITAL_COMMUNITY): Payer: Medicare Other

## 2014-07-29 ENCOUNTER — Emergency Department (HOSPITAL_COMMUNITY)
Admission: EM | Admit: 2014-07-29 | Discharge: 2014-07-29 | Disposition: A | Discharge status: Home / Self Care | Payer: Medicare Other | Attending: Emergency Medicine | Admitting: Emergency Medicine

## 2014-07-29 ENCOUNTER — Encounter (HOSPITAL_COMMUNITY): Payer: Self-pay | Admitting: Emergency Medicine

## 2014-07-29 DIAGNOSIS — Z8709 Personal history of other diseases of the respiratory system: Secondary | ICD-10-CM | POA: Insufficient documentation

## 2014-07-29 DIAGNOSIS — K279 Peptic ulcer, site unspecified, unspecified as acute or chronic, without hemorrhage or perforation: Secondary | ICD-10-CM | POA: Insufficient documentation

## 2014-07-29 DIAGNOSIS — Z87448 Personal history of other diseases of urinary system: Secondary | ICD-10-CM | POA: Diagnosis not present

## 2014-07-29 DIAGNOSIS — Z79899 Other long term (current) drug therapy: Secondary | ICD-10-CM | POA: Insufficient documentation

## 2014-07-29 DIAGNOSIS — I10 Essential (primary) hypertension: Secondary | ICD-10-CM | POA: Insufficient documentation

## 2014-07-29 DIAGNOSIS — Z8659 Personal history of other mental and behavioral disorders: Secondary | ICD-10-CM | POA: Diagnosis not present

## 2014-07-29 DIAGNOSIS — R1013 Epigastric pain: Secondary | ICD-10-CM | POA: Diagnosis present

## 2014-07-29 DIAGNOSIS — K219 Gastro-esophageal reflux disease without esophagitis: Secondary | ICD-10-CM | POA: Insufficient documentation

## 2014-07-29 DIAGNOSIS — R011 Cardiac murmur, unspecified: Secondary | ICD-10-CM | POA: Insufficient documentation

## 2014-07-29 DIAGNOSIS — Z792 Long term (current) use of antibiotics: Secondary | ICD-10-CM | POA: Insufficient documentation

## 2014-07-29 DIAGNOSIS — K529 Noninfective gastroenteritis and colitis, unspecified: Secondary | ICD-10-CM | POA: Diagnosis not present

## 2014-07-29 LAB — URINALYSIS, ROUTINE W REFLEX MICROSCOPIC
Bilirubin Urine: NEGATIVE
Glucose, UA: NEGATIVE mg/dL
Hgb urine dipstick: NEGATIVE
Ketones, ur: NEGATIVE mg/dL
Leukocytes, UA: NEGATIVE
Nitrite: NEGATIVE
Protein, ur: NEGATIVE mg/dL
Specific Gravity, Urine: 1.008 (ref 1.005–1.030)
Urobilinogen, UA: 0.2 mg/dL (ref 0.0–1.0)
pH: 6.5 (ref 5.0–8.0)

## 2014-07-29 LAB — CBC WITH DIFFERENTIAL/PLATELET
Basophils Absolute: 0 10*3/uL (ref 0.0–0.1)
Basophils Relative: 0 % (ref 0–1)
Eosinophils Absolute: 0 10*3/uL (ref 0.0–0.7)
Eosinophils Relative: 0 % (ref 0–5)
HCT: 39.5 % (ref 39.0–52.0)
Hemoglobin: 13.3 g/dL (ref 13.0–17.0)
Lymphocytes Relative: 29 % (ref 12–46)
Lymphs Abs: 1.6 10*3/uL (ref 0.7–4.0)
MCH: 31.4 pg (ref 26.0–34.0)
MCHC: 33.7 g/dL (ref 30.0–36.0)
MCV: 93.4 fL (ref 78.0–100.0)
Monocytes Absolute: 0.3 10*3/uL (ref 0.1–1.0)
Monocytes Relative: 5 % (ref 3–12)
Neutro Abs: 3.6 10*3/uL (ref 1.7–7.7)
Neutrophils Relative %: 66 % (ref 43–77)
Platelets: 259 10*3/uL (ref 150–400)
RBC: 4.23 MIL/uL (ref 4.22–5.81)
RDW: 11.8 % (ref 11.5–15.5)
WBC: 5.4 10*3/uL (ref 4.0–10.5)

## 2014-07-29 LAB — COMPREHENSIVE METABOLIC PANEL
ALT: 18 U/L (ref 0–53)
AST: 19 U/L (ref 0–37)
Albumin: 4.5 g/dL (ref 3.5–5.2)
Alkaline Phosphatase: 62 U/L (ref 39–117)
Anion gap: 9 (ref 5–15)
BUN: 7 mg/dL (ref 6–23)
CO2: 24 mmol/L (ref 19–32)
Calcium: 8.9 mg/dL (ref 8.4–10.5)
Chloride: 107 mmol/L (ref 96–112)
Creatinine, Ser: 0.96 mg/dL (ref 0.50–1.35)
GFR calc Af Amer: >90 mL/min (ref >90)
GFR calc non Af Amer: >90 mL/min (ref >90)
Glucose, Bld: 98 mg/dL (ref 70–99)
Potassium: 3.5 mmol/L (ref 3.5–5.1)
Sodium: 140 mmol/L (ref 135–145)
Total Bilirubin: 1.1 mg/dL (ref 0.3–1.2)
Total Protein: 7.6 g/dL (ref 6.0–8.3)

## 2014-07-29 LAB — D-DIMER, QUANTITATIVE (NOT AT ARMC): D-Dimer, Quant: <0.27 ug/mL-FEU (ref 0.00–0.48)

## 2014-07-29 LAB — LIPASE, BLOOD: Lipase: 20 U/L (ref 11–59)

## 2014-07-29 MED ORDER — PROMETHAZINE HCL 25 MG PO TABS: 25.0000 mg | ORAL_TABLET | Freq: Three times a day (TID) | ORAL | Status: DC | PRN

## 2014-07-29 MED ORDER — HYDROMORPHONE HCL 1 MG/ML IJ SOLN
1.0000 mg | Freq: Once | INTRAMUSCULAR | Status: AC
  Administered 2014-07-29: 1 mg via INTRAVENOUS
  Filled 2014-07-29: qty 1

## 2014-07-29 MED ORDER — DOCUSATE SODIUM 100 MG PO CAPS: 100.0000 mg | ORAL_CAPSULE | Freq: Two times a day (BID) | ORAL | Status: DC

## 2014-07-29 MED ORDER — IOHEXOL 300 MG/ML  SOLN
50.0000 mL | Freq: Once | INTRAMUSCULAR | Status: AC | PRN
  Administered 2014-07-29: 50 mL via ORAL

## 2014-07-29 MED ORDER — SODIUM CHLORIDE 0.9 % IV BOLUS (SEPSIS)
1000.0000 mL | Freq: Once | INTRAVENOUS | Status: AC
  Administered 2014-07-29: 1000 mL via INTRAVENOUS

## 2014-07-29 MED ORDER — IOHEXOL 300 MG/ML  SOLN
100.0000 mL | Freq: Once | INTRAMUSCULAR | Status: AC | PRN
  Administered 2014-07-29: 100 mL via INTRAVENOUS

## 2014-07-29 MED ORDER — FENTANYL CITRATE 0.05 MG/ML IJ SOLN: 50.0000 ug | Freq: Once | INTRAMUSCULAR | Status: DC

## 2014-07-29 MED ORDER — ONDANSETRON HCL 4 MG/2ML IJ SOLN
4.0000 mg | Freq: Once | INTRAMUSCULAR | Status: AC
  Administered 2014-07-29: 4 mg via INTRAVENOUS
  Filled 2014-07-29: qty 2

## 2014-07-29 MED ORDER — HYDROCODONE-ACETAMINOPHEN 5-325 MG PO TABS: 1.0000 | ORAL_TABLET | Freq: Four times a day (QID) | ORAL | Status: DC | PRN

## 2014-07-29 NOTE — ED Provider Notes (Signed)
Complains of epigastric pain for "a couple months" with vomiting and difficulty moving his bowels. Patient seen here as ago prescribed clarithromycin, amoxicillin, sucralfate and omeprazole. States she hasn't gotten better. Patient is presently alert and in no distress plan prescriptions for Colace, Phenergan, MiraLAX, GI referral. Results for orders placed or performed during the hospital encounter of 07/29/14  Comprehensive metabolic panel  Result Value Ref Range   Sodium 140 135 - 145 mmol/L   Potassium 3.5 3.5 - 5.1 mmol/L   Chloride 107 96 - 112 mmol/L   CO2 24 19 - 32 mmol/L   Glucose, Bld 98 70 - 99 mg/dL   BUN 7 6 - 23 mg/dL   Creatinine, Ser 0.96 0.50 - 1.35 mg/dL   Calcium 8.9 8.4 - 10.5 mg/dL   Total Protein 7.6 6.0 - 8.3 g/dL   Albumin 4.5 3.5 - 5.2 g/dL   AST 19 0 - 37 U/L   ALT 18 0 - 53 U/L   Alkaline Phosphatase 62 39 - 117 U/L   Total Bilirubin 1.1 0.3 - 1.2 mg/dL   GFR calc non Af Amer >90 >90 mL/min   GFR calc Af Amer >90 >90 mL/min   Anion gap 9 5 - 15  CBC with Differential  Result Value Ref Range   WBC 5.4 4.0 - 10.5 K/uL   RBC 4.23 4.22 - 5.81 MIL/uL   Hemoglobin 13.3 13.0 - 17.0 g/dL   HCT 39.5 39.0 - 52.0 %   MCV 93.4 78.0 - 100.0 fL   MCH 31.4 26.0 - 34.0 pg   MCHC 33.7 30.0 - 36.0 g/dL   RDW 11.8 11.5 - 15.5 %   Platelets 259 150 - 400 K/uL   Neutrophils Relative % 66 43 - 77 %   Neutro Abs 3.6 1.7 - 7.7 K/uL   Lymphocytes Relative 29 12 - 46 %   Lymphs Abs 1.6 0.7 - 4.0 K/uL   Monocytes Relative 5 3 - 12 %   Monocytes Absolute 0.3 0.1 - 1.0 K/uL   Eosinophils Relative 0 0 - 5 %   Eosinophils Absolute 0.0 0.0 - 0.7 K/uL   Basophils Relative 0 0 - 1 %   Basophils Absolute 0.0 0.0 - 0.1 K/uL  Urinalysis, Routine w reflex microscopic  Result Value Ref Range   Color, Urine YELLOW YELLOW   APPearance CLEAR CLEAR   Specific Gravity, Urine 1.008 1.005 - 1.030   pH 6.5 5.0 - 8.0   Glucose, UA NEGATIVE NEGATIVE mg/dL   Hgb urine dipstick NEGATIVE  NEGATIVE   Bilirubin Urine NEGATIVE NEGATIVE   Ketones, ur NEGATIVE NEGATIVE mg/dL   Protein, ur NEGATIVE NEGATIVE mg/dL   Urobilinogen, UA 0.2 0.0 - 1.0 mg/dL   Nitrite NEGATIVE NEGATIVE   Leukocytes, UA NEGATIVE NEGATIVE  Lipase, blood  Result Value Ref Range   Lipase 20 11 - 59 U/L   Dg Chest 2 View  07/25/2014   CLINICAL DATA:  Pt c/o left sided chest pain that radiates "all around", SOB , cough, congestion, leg swelling x 1 month. Hx of hypertension. No hx of smoking.  EXAM: CHEST  2 VIEW  COMPARISON:  07/14/2014  FINDINGS: Heart, mediastinum and hila are unremarkable.  Small nodule projects in the lower lung zone on the frontal view, not evident on the lateral view. This is most likely a nipple shadow.  Lungs are otherwise clear.  No pleural effusion or pneumothorax.  Bony thorax is unremarkable.  IMPRESSION: No acute cardiopulmonary disease.  Small  nodular opacity projecting over the lower lung zone on the left most likely a nipple shadow. Recommend repeat frontal chest radiograph with nipple markers for confirmation.   Electronically Signed   By: Lajean Manes M.D.   On: 07/25/2014 17:11   Dg Chest 2 View  07/14/2014   CLINICAL DATA:  Chest pain  EXAM: CHEST  2 VIEW  COMPARISON:  01/24/2013  FINDINGS: Normal heart size and mediastinal contours. Bilateral nipple shadows again noted. No acute infiltrate or edema. No effusion or pneumothorax. No acute osseous findings.  IMPRESSION: No active cardiopulmonary disease.   Electronically Signed   By: Monte Fantasia M.D.   On: 07/14/2014 20:15   Ct Abdomen Pelvis W Contrast  07/29/2014   CLINICAL DATA:  54 year old male with epigastric, chest and abdominal pain plus vomiting. Initial encounter.  EXAM: CT ABDOMEN AND PELVIS WITH CONTRAST  TECHNIQUE: Multidetector CT imaging of the abdomen and pelvis was performed using the standard protocol following bolus administration of intravenous contrast.  CONTRAST:  130mL OMNIPAQUE IOHEXOL 300 MG/ML SOLN,  33mL OMNIPAQUE IOHEXOL 300 MG/ML SOLN  COMPARISON:  Chest radiographs 07/25/2014 and earlier.  FINDINGS: Negative lung bases except for mild atelectasis. No pericardial or pleural effusion.  No acute osseous abnormality identified.  No pelvic free fluid.  Unremarkable bladder.  Negative rectum.  Redundant sigmoid colon, otherwise negative. Left colon within normal limits. The splenic flexure appears mildly thick walled and indistinct (series 2, image 31). The mid transverse colon has a more normal appearance. Retained stool in the proximal transverse and right colon. Normal appendix.  No dilated small bowel. Contrast and air distending the stomach and proximal duodenum. The gastric antrum is thick walled. No surrounding stranding. Oral contrast has not yet reached the distal small bowel.  Liver, gallbladder, spleen, pancreas, adrenal glands, portal venous system, and major arterial structures are within normal limits. Renal enhancement and contrast excretion within normal limits. No abdominal free fluid. No lymphadenopathy  IMPRESSION: 1. Two areas of possible acute intestinal inflammation: Gastric antrum (such as due to peptic ulcer disease), and splenic flexure (such as with infectious colitis). No associated abdominal fluid or fat stranding in these areas. 2. Otherwise negative CT abdomen and pelvis.   Electronically Signed   By: Genevie Ann M.D.   On: 07/29/2014 15:32     Orlie Dakin, MD 07/29/14 1556

## 2014-07-29 NOTE — ED Notes (Signed)
Pt c/o chest and abd pain and vomiting, was seen for this on the 20th and states it has not gotten better.

## 2014-07-29 NOTE — ED Provider Notes (Signed)
CSN: [id number]     Arrival date & time 07/29/14  1254 History   First MD Initiated Contact with Patient 07/29/14 1335     Chief Complaint  Patient presents with  . Chest Pain  . Abdominal Pain  . Emesis     (Consider location/radiation/quality/duration/timing/severity/associated sxs/prior Treatment) HPI   Patient recently seen in the ED (2/20) with the same complaint of epigastric and left sided chest pain that has been intermittent for the past couple of weeks.  He describes a sharp pain in the left side and burning substernal pain.  He also describes difficulty swallowing accompanied by nausea and vomiting.  He states "his bowels aren't moving".  Denies hematemesis, hemoptysis, melena, or hematochezia.  His wife states that he becomes diaphoretic when the pain comes.  Endorses SOB as well.  Patient denies weakness, dizziness, back pain, dysuria, neck pain, fever, cough, runny nose, sore throat, or syncope  Past Medical History  Diagnosis Date  . Hypertension   . Hyperlipidemia   . GERD (gastroesophageal reflux disease)   . Seasonal allergies   . Depression   . Anxiety   . Panic   . Insomnia   . Erectile dysfunction    Past Surgical History  Procedure Laterality Date  . Hernia repair     No family history on file. History  Substance Use Topics  . Smoking status: Never Smoker   . Smokeless tobacco: Never Used  . Alcohol Use: Yes    Review of Systems  All other systems negative except as documented in the HPI. All pertinent positives and negatives as reviewed in the HPI.   Allergies  Review of patient's allergies indicates no known allergies.  Home Medications   Prior to Admission medications   Medication Sig Start Date End Date Taking? Authorizing Provider  amoxicillin (AMOXIL) 500 MG capsule Take 2 capsules (1,000 mg total) by mouth 2 (two) times daily. 07/25/14  Yes Carrie Mew, PA-C  Chlorphen-PE-Acetaminophen (SINUS CONGESTION/PAIN DAY/NGHT PO) Take 1 tablet  by mouth daily as needed (sinus congestion).   Yes Historical Provider, MD  clarithromycin (BIAXIN) 500 MG tablet Take 1 tablet (500 mg total) by mouth 2 (two) times daily. 07/25/14  Yes Carrie Mew, PA-C  lisinopril (PRINIVIL,ZESTRIL) 10 MG tablet Take 10 mg by mouth daily.   Yes Historical Provider, MD  mometasone (NASONEX) 50 MCG/ACT nasal spray Place 2 sprays into the nose daily as needed (congestion).   Yes Historical Provider, MD  pantoprazole (PROTONIX) 20 MG tablet Take 1 tablet (20 mg total) by mouth daily. 07/25/14  Yes Carrie Mew, PA-C  sucralfate (CARAFATE) 1 G tablet Take 1 tablet (1 g total) by mouth 4 (four) times daily. 07/25/14  Yes Carrie Mew, PA-C   BP 155/92 mmHg  Pulse 84  Temp(Src) 97.4 F (36.3 C) (Oral)  Resp 17  SpO2 98% Physical Exam  Constitutional: He is oriented to person, place, and time. He appears well-developed and well-nourished. No distress.  HENT:  Head: Normocephalic and atraumatic.  Mouth/Throat: Oropharynx is clear and moist.  Eyes: Conjunctivae and EOM are normal. Pupils are equal, round, and reactive to light.  Neck: Normal range of motion. Neck supple.  Cardiovascular: Normal rate, regular rhythm and intact distal pulses.  Exam reveals no friction rub.   Murmur heard. Pulmonary/Chest: Effort normal and breath sounds normal. No respiratory distress. He has no wheezes. He has no rales.  Abdominal: Soft. Bowel sounds are normal. He exhibits no distension. There is generalized  tenderness. There is no rebound and no guarding.  Musculoskeletal: He exhibits no edema.  Neurological: He is alert and oriented to person, place, and time. He exhibits normal muscle tone. Coordination normal.  Skin: Skin is warm, dry and intact. No rash noted. He is not diaphoretic.  Psychiatric: He has a normal mood and affect. His speech is normal and behavior is normal.  Nursing note and vitals reviewed.   ED Course  Procedures (including critical care  time) Labs Review Labs Reviewed  COMPREHENSIVE METABOLIC PANEL  CBC WITH DIFFERENTIAL/PLATELET  URINALYSIS, ROUTINE W REFLEX MICROSCOPIC  LIPASE, BLOOD  D-DIMER, QUANTITATIVE    Imaging Review No results found.   EKG Interpretation   Date/Time:  Wednesday July 29 2014 13:01:57 EST Ventricular Rate:  67 PR Interval:  154 QRS Duration: 91 QT Interval:  403 QTC Calculation: 425 R Axis:   92 Text Interpretation:  Sinus rhythm Borderline right axis deviation  Consider left ventricular hypertrophy Anterior ST elevation, probably due  to LVH Baseline wander in lead(s) V1 V5 No significant change since last  tracing Confirmed by Winfred Leeds  MD, SAM [phone]) on 07/29/2014 1:06:35 PM      The patient will be treated for peptic ulcer disease and the colitis.  He will be referred to GI for further evaluation and care.  The patient's symptoms are consistent again with peptic ulcer disease, along with the CT scan findings.  Patient is currently taking antibiotics and anti-reflux medication   Brent General, PA-C 08/02/14 1626  Orlie Dakin, MD 08/03/14 1025

## 2014-07-29 NOTE — Discharge Instructions (Signed)
Return here as needed.  Follow-up with the GI Dr. provided.  Your testing here today show that she have some inflammation in her stomach and colon

## 2014-08-04 ENCOUNTER — Emergency Department (HOSPITAL_COMMUNITY)
Admission: EM | Admit: 2014-08-04 | Discharge: 2014-08-04 | Disposition: A | Discharge status: Home / Self Care | Payer: Medicare Other | Attending: Emergency Medicine | Admitting: Emergency Medicine

## 2014-08-04 ENCOUNTER — Emergency Department (HOSPITAL_COMMUNITY): Payer: Medicare Other

## 2014-08-04 ENCOUNTER — Encounter (HOSPITAL_COMMUNITY): Payer: Self-pay | Admitting: *Deleted

## 2014-08-04 DIAGNOSIS — R630 Anorexia: Secondary | ICD-10-CM | POA: Diagnosis not present

## 2014-08-04 DIAGNOSIS — Z8659 Personal history of other mental and behavioral disorders: Secondary | ICD-10-CM | POA: Insufficient documentation

## 2014-08-04 DIAGNOSIS — R079 Chest pain, unspecified: Secondary | ICD-10-CM | POA: Insufficient documentation

## 2014-08-04 DIAGNOSIS — I10 Essential (primary) hypertension: Secondary | ICD-10-CM | POA: Diagnosis not present

## 2014-08-04 DIAGNOSIS — Z8709 Personal history of other diseases of the respiratory system: Secondary | ICD-10-CM | POA: Insufficient documentation

## 2014-08-04 DIAGNOSIS — K219 Gastro-esophageal reflux disease without esophagitis: Secondary | ICD-10-CM | POA: Insufficient documentation

## 2014-08-04 DIAGNOSIS — Z87448 Personal history of other diseases of urinary system: Secondary | ICD-10-CM | POA: Insufficient documentation

## 2014-08-04 DIAGNOSIS — R1013 Epigastric pain: Secondary | ICD-10-CM

## 2014-08-04 DIAGNOSIS — R112 Nausea with vomiting, unspecified: Secondary | ICD-10-CM | POA: Diagnosis not present

## 2014-08-04 DIAGNOSIS — Z792 Long term (current) use of antibiotics: Secondary | ICD-10-CM | POA: Insufficient documentation

## 2014-08-04 DIAGNOSIS — Z79899 Other long term (current) drug therapy: Secondary | ICD-10-CM | POA: Diagnosis not present

## 2014-08-04 DIAGNOSIS — R1084 Generalized abdominal pain: Secondary | ICD-10-CM | POA: Diagnosis present

## 2014-08-04 DIAGNOSIS — Z8669 Personal history of other diseases of the nervous system and sense organs: Secondary | ICD-10-CM | POA: Insufficient documentation

## 2014-08-04 LAB — CBC WITH DIFFERENTIAL/PLATELET
Basophils Absolute: 0 10*3/uL (ref 0.0–0.1)
Basophils Relative: 0 % (ref 0–1)
Eosinophils Absolute: 0 10*3/uL (ref 0.0–0.7)
Eosinophils Relative: 0 % (ref 0–5)
HEMATOCRIT: 36.9 % — AB (ref 39.0–52.0)
Hemoglobin: 12.6 g/dL — ABNORMAL LOW (ref 13.0–17.0)
LYMPHS PCT: 33 % (ref 12–46)
Lymphs Abs: 1.8 10*3/uL (ref 0.7–4.0)
MCH: 32.3 pg (ref 26.0–34.0)
MCHC: 34.1 g/dL (ref 30.0–36.0)
MCV: 94.6 fL (ref 78.0–100.0)
MONOS PCT: 5 % (ref 3–12)
Monocytes Absolute: 0.3 10*3/uL (ref 0.1–1.0)
NEUTROS ABS: 3.4 10*3/uL (ref 1.7–7.7)
Neutrophils Relative %: 62 % (ref 43–77)
Platelets: 243 10*3/uL (ref 150–400)
RBC: 3.9 MIL/uL — ABNORMAL LOW (ref 4.22–5.81)
RDW: 11.8 % (ref 11.5–15.5)
WBC: 5.6 10*3/uL (ref 4.0–10.5)

## 2014-08-04 LAB — COMPREHENSIVE METABOLIC PANEL
ALT: 23 U/L (ref 0–53)
AST: 22 U/L (ref 0–37)
Albumin: 4.4 g/dL (ref 3.5–5.2)
Alkaline Phosphatase: 62 U/L (ref 39–117)
Anion gap: 8 (ref 5–15)
BUN: 9 mg/dL (ref 6–23)
CHLORIDE: 108 mmol/L (ref 96–112)
CO2: 25 mmol/L (ref 19–32)
CREATININE: 0.92 mg/dL (ref 0.50–1.35)
Calcium: 8.7 mg/dL (ref 8.4–10.5)
GFR calc Af Amer: >90 mL/min (ref >90)
GFR calc non Af Amer: >90 mL/min (ref >90)
Glucose, Bld: 105 mg/dL — ABNORMAL HIGH (ref 70–99)
Potassium: 3.9 mmol/L (ref 3.5–5.1)
SODIUM: 141 mmol/L (ref 135–145)
Total Bilirubin: 0.7 mg/dL (ref 0.3–1.2)
Total Protein: 7.4 g/dL (ref 6.0–8.3)

## 2014-08-04 LAB — TROPONIN I: Troponin I: <0.03 ng/mL (ref <0.031)

## 2014-08-04 LAB — LIPASE, BLOOD: Lipase: 25 U/L (ref 11–59)

## 2014-08-04 MED ORDER — GI COCKTAIL ~~LOC~~
30.0000 mL | Freq: Once | ORAL | Status: AC
  Administered 2014-08-04: 30 mL via ORAL
  Filled 2014-08-04: qty 30

## 2014-08-04 NOTE — Discharge Instructions (Signed)
Abdominal Pain °Many things can cause abdominal pain. Usually, abdominal pain is not caused by a disease and will improve without treatment. It can often be observed and treated at home. Your health care provider will do a physical exam and possibly order blood tests and X-rays to help determine the seriousness of your pain. However, in many cases, more time must pass before a clear cause of the pain can be found. Before that point, your health care provider may not know if you need more testing or further treatment. °HOME CARE INSTRUCTIONS  °Monitor your abdominal pain for any changes. The following actions may help to alleviate any discomfort you are experiencing: °· Only take over-the-counter or prescription medicines as directed by your health care provider. °· Do not take laxatives unless directed to do so by your health care provider. °· Try a clear liquid diet (broth, tea, or water) as directed by your health care provider. Slowly move to a bland diet as tolerated. °SEEK MEDICAL CARE IF: °· You have unexplained abdominal pain. °· You have abdominal pain associated with nausea or diarrhea. °· You have pain when you urinate or have a bowel movement. °· You experience abdominal pain that wakes you in the night. °· You have abdominal pain that is worsened or improved by eating food. °· You have abdominal pain that is worsened with eating fatty foods. °· You have a fever. °SEEK IMMEDIATE MEDICAL CARE IF:  °· Your pain does not go away within 2 hours. °· You keep throwing up (vomiting). °· Your pain is felt only in portions of the abdomen, such as the right side or the left lower portion of the abdomen. °· You pass bloody or black tarry stools. °MAKE SURE YOU: °· Understand these instructions.   °· Will watch your condition.   °· Will get help right away if you are not doing well or get worse.   °Document Released: 03/01/2005 Document Revised: 05/27/2013 Document Reviewed: 01/29/2013 °[employer]® Patient Information  ©2015 [employer]. This information is not intended to replace advice given to you by your health care provider. Make sure you discuss any questions you have with your health care provider. ° °Chest Pain (Nonspecific) °It is often hard to give a specific diagnosis for the cause of chest pain. There is always a chance that your pain could be related to something serious, such as a heart attack or a blood clot in the lungs. You need to follow up with your health care provider for further evaluation. °CAUSES  °· Heartburn. °· Pneumonia or bronchitis. °· Anxiety or stress. °· Inflammation around your heart (pericarditis) or lung (pleuritis or pleurisy). °· A blood clot in the lung. °· A collapsed lung (pneumothorax). It can develop suddenly on its own (spontaneous pneumothorax) or from trauma to the chest. °· Shingles infection (herpes zoster virus). °The chest wall is composed of bones, muscles, and cartilage. Any of these can be the source of the pain. °· The bones can be bruised by injury. °· The muscles or cartilage can be strained by coughing or overwork. °· The cartilage can be affected by inflammation and become sore (costochondritis). °DIAGNOSIS  °Lab tests or other studies may be needed to find the cause of your pain. Your health care provider may have you take a test called an ambulatory electrocardiogram (ECG). An ECG records your heartbeat patterns over a 24-hour period. You may also have other tests, such as: °· Transthoracic echocardiogram (TTE). During echocardiography, sound waves are used to evaluate how blood   flows through your heart. °· Transesophageal echocardiogram (TEE). °· Cardiac monitoring. This allows your health care provider to monitor your heart rate and rhythm in real time. °· Holter monitor. This is a portable device that records your heartbeat and can help diagnose heart arrhythmias. It allows your health care provider to track your heart activity for several days, if needed. °· Stress  tests by exercise or by giving medicine that makes the heart beat faster. °TREATMENT  °· Treatment depends on what may be causing your chest pain. Treatment may include: °¨ Acid blockers for heartburn. °¨ Anti-inflammatory medicine. °¨ Pain medicine for inflammatory conditions. °¨ Antibiotics if an infection is present. °· You may be advised to change lifestyle habits. This includes stopping smoking and avoiding alcohol, caffeine, and chocolate. °· You may be advised to keep your head raised (elevated) when sleeping. This reduces the chance of acid going backward from your stomach into your esophagus. °Most of the time, nonspecific chest pain will improve within 2-3 days with rest and mild pain medicine.  °HOME CARE INSTRUCTIONS  °· If antibiotics were prescribed, take them as directed. Finish them even if you start to feel better. °· For the next few days, avoid physical activities that bring on chest pain. Continue physical activities as directed. °· Do not use any tobacco products, including cigarettes, chewing tobacco, or electronic cigarettes. °· Avoid drinking alcohol. °· Only take medicine as directed by your health care provider. °· Follow your health care provider's suggestions for further testing if your chest pain does not go away. °· Keep any follow-up appointments you made. If you do not go to an appointment, you could develop lasting (chronic) problems with pain. If there is any problem keeping an appointment, call to reschedule. °SEEK MEDICAL CARE IF:  °· Your chest pain does not go away, even after treatment. °· You have a rash with blisters on your chest. °· You have a fever. °SEEK IMMEDIATE MEDICAL CARE IF:  °· You have increased chest pain or pain that spreads to your arm, neck, jaw, back, or abdomen. °· You have shortness of breath. °· You have an increasing cough, or you cough up blood. °· You have severe back or abdominal pain. °· You feel nauseous or vomit. °· You have severe weakness. °· You  faint. °· You have chills. °This is an emergency. Do not wait to see if the pain will go away. Get medical help at once. Call your local emergency services (911 in [place] Do not drive yourself to the hospital. °MAKE SURE YOU:  °· Understand these instructions. °· Will watch your condition. °· Will get help right away if you are not doing well or get worse. °Document Released: 03/01/2005 Document Revised: 05/27/2013 Document Reviewed: 12/26/2007 °[employer]® Patient Information ©2015 [employer]. This information is not intended to replace advice given to you by your health care provider. Make sure you discuss any questions you have with your health care provider. ° °

## 2014-08-04 NOTE — ED Provider Notes (Signed)
CSN: [id number]     Arrival date & time 08/04/14  1609 History   First MD Initiated Contact with Patient 08/04/14 1621     Chief Complaint  Patient presents with  . Chest Pain     (Consider location/radiation/quality/duration/timing/severity/associated sxs/prior Treatment) Patient is a 54 y.o. male presenting with abdominal pain.  Abdominal Pain Pain location:  Generalized Pain quality: aching and sharp   Pain radiates to:  Does not radiate Pain severity:  Severe Onset quality:  Gradual Duration:  3 weeks Timing:  Constant Progression:  Unchanged Chronicity:  New Context comment:  Seen 4 x in last month for same symptoms Relieved by:  Nothing Worsened by:  Palpation and movement Associated symptoms: anorexia, belching, chest pain (radiating from abd), hematochezia (brb on tp), nausea and vomiting   Associated symptoms: no diarrhea, no dysuria, no fever, no hematuria and no melena     Past Medical History  Diagnosis Date  . Hypertension   . Hyperlipidemia   . GERD (gastroesophageal reflux disease)   . Seasonal allergies   . Depression   . Anxiety   . Panic   . Insomnia   . Erectile dysfunction    Past Surgical History  Procedure Laterality Date  . Hernia repair     No family history on file. History  Substance Use Topics  . Smoking status: Never Smoker   . Smokeless tobacco: Never Used  . Alcohol Use: Yes    Review of Systems  Constitutional: Negative for fever.  Cardiovascular: Positive for chest pain (radiating from abd).  Gastrointestinal: Positive for nausea, vomiting, abdominal pain, hematochezia (brb on tp) and anorexia. Negative for diarrhea and melena.  Genitourinary: Negative for dysuria and hematuria.  All other systems reviewed and are negative.     Allergies  Review of patient's allergies indicates no known allergies.  Home Medications   Prior to Admission medications   Medication Sig Start Date End Date Taking? Authorizing Provider   docusate sodium (COLACE) 100 MG capsule Take 1 capsule (100 mg total) by mouth every 12 (twelve) hours. 07/29/14  Yes Resa Miner Lawyer, PA-C  HYDROcodone-acetaminophen (NORCO/VICODIN) 5-325 MG per tablet Take 1 tablet by mouth every 6 (six) hours as needed for moderate pain. 07/29/14  Yes Resa Miner Lawyer, PA-C  lisinopril (PRINIVIL,ZESTRIL) 10 MG tablet Take 10 mg by mouth daily.   Yes Historical Provider, MD  mometasone (NASONEX) 50 MCG/ACT nasal spray Place 2 sprays into the nose daily as needed (congestion).   Yes Historical Provider, MD  promethazine (PHENERGAN) 25 MG tablet Take 1 tablet (25 mg total) by mouth every 8 (eight) hours as needed for nausea or vomiting. 07/29/14  Yes Resa Miner Lawyer, PA-C  amoxicillin (AMOXIL) 500 MG capsule Take 2 capsules (1,000 mg total) by mouth 2 (two) times daily. Patient not taking: Reported on 08/04/2014 07/25/14   Carrie Mew, PA-C  clarithromycin (BIAXIN) 500 MG tablet Take 1 tablet (500 mg total) by mouth 2 (two) times daily. Patient not taking: Reported on 08/04/2014 07/25/14   Carrie Mew, PA-C  pantoprazole (PROTONIX) 20 MG tablet Take 1 tablet (20 mg total) by mouth daily. Patient not taking: Reported on 08/04/2014 07/25/14   Carrie Mew, PA-C  sucralfate (CARAFATE) 1 G tablet Take 1 tablet (1 g total) by mouth 4 (four) times daily. Patient not taking: Reported on 08/04/2014 07/25/14   Carrie Mew, PA-C   BP 150/85 mmHg  Pulse 71  Temp(Src) 98.6 F (37 C) (Oral)  Resp  16  SpO2 99% Physical Exam  Constitutional: He is oriented to person, place, and time. He appears well-developed and well-nourished.  HENT:  Head: Normocephalic and atraumatic.  Eyes: Conjunctivae and EOM are normal.  Neck: Normal range of motion. Neck supple.  Cardiovascular: Normal rate, regular rhythm and normal heart sounds.   Pulmonary/Chest: Effort normal and breath sounds normal. No respiratory distress.  Abdominal: He exhibits no distension. There is  generalized tenderness. There is no rebound and no guarding.  Musculoskeletal: Normal range of motion.  Neurological: He is alert and oriented to person, place, and time.  Skin: Skin is warm and dry.  Vitals reviewed.   ED Course  Procedures (including critical care time) Labs Review Labs Reviewed  CBC WITH DIFFERENTIAL/PLATELET - Abnormal; Notable for the following:    RBC 3.90 (*)    Hemoglobin 12.6 (*)    HCT 36.9 (*)    All other components within normal limits  COMPREHENSIVE METABOLIC PANEL - Abnormal; Notable for the following:    Glucose, Bld 105 (*)    All other components within normal limits  LIPASE, BLOOD  TROPONIN I    Imaging Review Dg Chest 2 View  08/04/2014   CLINICAL DATA:  LEFT ANTERIOR CHEST PAIN. COUGH AND CHEST CONGESTION. SHORTNESS OF BREATH. ABDOMINAL PAIN.  EXAM: CHEST  2 VIEW  COMPARISON:  07/25/2014  FINDINGS: HEART SIZE AND VASCULARITY ARE NORMAL AND THE LUNGS ARE CLEAR. NIPPLE SHADOWS AT THE BASES. NO EFFUSIONS. NO OSSEOUS ABNORMALITY.  IMPRESSION: NORMAL CHEST.   Electronically Signed   By: Lorriane Shire M.D.   On: 08/04/2014 18:34   Dg Abd 1 View  08/04/2014   CLINICAL DATA:  Pain and constipation for several weeks  EXAM: ABDOMEN - 1 VIEW  COMPARISON:  CT abdomen and pelvis July 29, 2014  FINDINGS: There is moderate stool in the colon. The bowel gas pattern is unremarkable. No obstruction or free air is seen on this supine examination. There are no abnormal calcifications.  IMPRESSION: Overall bowel gas pattern unremarkable. Moderate stool in colon. No free air.   Electronically Signed   By: Lowella Grip III M.D.   On: 08/04/2014 18:33     EKG Interpretation   Date/Time:  Tuesday August 04 2014 16:23:01 EST Ventricular Rate:  76 PR Interval:  169 QRS Duration: 89 QT Interval:  398 QTC Calculation: 447 R Axis:   75 Text Interpretation:  Sinus rhythm Left ventricular hypertrophy No  significant change since last tracing Confirmed by Debby Freiberg [phone])  on 08/04/2014 4:35:27 PM      MDM   Final diagnoses:  Epigastric pain    54 y.o. male with pertinent PMH of HTN, anxiety presents with continued epigastric and generalized abd pain with radiation into chest.  He has been seen x4 for same symptoms, denies recent change.  He attempted to get a GI appointment, however they required a PCP.  He has been unable to obtain a PCP so presents today.  On arrival vitals and physical exam as above.  Reassuring exam for ACS.  Pt has abd tenderness, however this is consistent across multiple visits.  Wu unremarkable.  Pain improved by GI cocktail.  Case management provided list of PCPs in effort to improve fu.  Doubt ACS, PTX, PNA, or other emergent chest pathology.  Doubt AAA, dissection, or other vascular pathology given recent negative imaging.  Continue to suspected PUD as etiology, and pt needs fu with GI, this was stressed to him.  I have reviewed all laboratory and imaging studies if ordered as above  1. Epigastric pain         Debby Freiberg, MD 08/05/14 1059

## 2014-08-04 NOTE — Progress Notes (Signed)
EDCM spoke to patient at bedside.  Patient noted to have visited the ED four times within the last six months.  Patient confirms he has Medicare and Medicaid insurance.  Patient gets his prescriptions filled at [facility].  Per patient, patient's wife has been trying to call the [facility] to schedule an appointment without success.  EDCM called [facility] and was informed to have patient call between 9am and 930am to schedule an appointment.  [facility] informed patient of this information and also provided patient with list of pcps who accept Medicare insurance within a ten mile radius of patient zip code [phone].  Also informed patient of urgent care centers for medical care.  Patient thankful for resources.  No further EDCM needs at this time.

## 2014-08-04 NOTE — ED Notes (Signed)
Pt complains of sharp, stabbing chest pain and shortness of breath since yesterday. Pt also complains of abdominal pain/nausea/vomiting.

## 2014-08-04 NOTE — ED Notes (Signed)
[name] [name] is getting the blood

## 2014-08-06 ENCOUNTER — Ambulatory Visit: Payer: Medicare Other | Attending: Family Medicine | Admitting: Family Medicine

## 2014-08-06 VITALS — BP 150/99 | HR 73 | Temp 97.9°F | Resp 16 | Ht 66.0 in | Wt 147.0 lb

## 2014-08-06 DIAGNOSIS — K2100 Gastro-esophageal reflux disease with esophagitis, without bleeding: Secondary | ICD-10-CM

## 2014-08-06 DIAGNOSIS — K219 Gastro-esophageal reflux disease without esophagitis: Secondary | ICD-10-CM | POA: Insufficient documentation

## 2014-08-06 MED ORDER — LISINOPRIL 20 MG PO TABS: 20.0000 mg | ORAL_TABLET | Freq: Every day | ORAL | Status: DC

## 2014-08-06 MED ORDER — PANTOPRAZOLE SODIUM 40 MG PO TBEC: 40.0000 mg | DELAYED_RELEASE_TABLET | Freq: Every day | ORAL | Status: DC

## 2014-08-06 MED ORDER — SUCRALFATE 1 GM/10ML PO SUSP: 1.0000 g | Freq: Three times a day (TID) | ORAL | Status: DC

## 2014-08-06 NOTE — Progress Notes (Signed)
Patient here to follow up after recent trip to hospital for epigastric pain This is a chronic problem for him and he has come to establish care with a PCP here and to get a possible referral to a GI MD Patient needs refills on medications and did take his blood pressure medication today

## 2014-08-06 NOTE — Patient Instructions (Addendum)
Take medications as ordered. Return for BP check with nurse in 2 weeks.  Make appointment with primary care dr. Here for 3-4 weeks Will call about referral.Place gastroesophageal reflux patient instructions here.

## 2014-08-07 ENCOUNTER — Encounter: Payer: Self-pay | Admitting: Gastroenterology

## 2014-08-24 ENCOUNTER — Ambulatory Visit: Payer: Medicare Other | Attending: Internal Medicine | Admitting: *Deleted

## 2014-08-24 VITALS — BP 152/73 | HR 76 | Temp 97.6°F | Resp 18

## 2014-08-24 DIAGNOSIS — R030 Elevated blood-pressure reading, without diagnosis of hypertension: Secondary | ICD-10-CM

## 2014-08-24 DIAGNOSIS — IMO0001 Reserved for inherently not codable concepts without codable children: Secondary | ICD-10-CM

## 2014-08-24 DIAGNOSIS — I10 Essential (primary) hypertension: Secondary | ICD-10-CM | POA: Insufficient documentation

## 2014-08-24 NOTE — Progress Notes (Signed)
Patient presents in no apparent distress for BP check Med list reviewed; states taking all meds as directed Discussed need for low sodium diet and using Mrs. Dash as alternative to salt Encouraged to choose foods with 5% or less of daily value for sodium. Discussed walking 30 minutes per day for exercise Patient c/o intermittent headaches, blurred vision, SHOB, and chest pain Patient c/o hard stools. Discussed increasing dietary fiber, water intake and exercise. Discussed using OTC docusate sodium or miralax. Patient states he's tried docusate in past without relief.  BP 152/73 P 76 R 18  T  97.6 oral SPO2  96%  Patient advised to schedule first available appt with a PCP to establish care  Patient given literature on DASH Eating Plan and Constipation

## 2014-08-24 NOTE — Patient Instructions (Addendum)
Constipation Constipation is when a person:  Poops (has a bowel movement) less than 3 times a week.  Has a hard time pooping.  Has poop that is dry, hard, or bigger than normal. HOME CARE   Eat foods with a lot of fiber in them. This includes fruits, vegetables, beans, and whole grains such as brown rice.  Avoid fatty foods and foods with a lot of sugar. This includes french fries, hamburgers, cookies, candy, and soda.  If you are not getting enough fiber from food, take products with added fiber in them (supplements).  Drink enough fluid to keep your pee (urine) clear or pale yellow.  Exercise on a regular basis, or as told by your doctor.  Go to the restroom when you feel like you need to poop. Do not hold it.  Only take medicine as told by your doctor. Do not take medicines that help you poop (laxatives) without talking to your doctor first. GET HELP RIGHT AWAY IF:   You have bright red blood in your poop (stool).  Your constipation lasts more than 4 days or gets worse.  You have belly (abdominal) or butt (rectal) pain.  You have thin poop (as thin as a pencil).  You lose weight, and it cannot be explained. MAKE SURE YOU:   Understand these instructions.  Will watch your condition.  Will get help right away if you are not doing well or get worse. Document Released: 11/08/2007 Document Revised: 05/27/2013 Document Reviewed: 03/03/2013 [facility] Patient Information 2015 [place], [place]. This information is not intended to replace advice given to you by your health care provider. Make sure you discuss any questions you have with your health care provider. DASH Eating Plan DASH stands for "Dietary Approaches to Stop Hypertension." The DASH eating plan is a healthy eating plan that has been shown to reduce high blood pressure (hypertension). Additional health benefits may include reducing the risk of type 2 diabetes mellitus, heart disease, and stroke. The DASH eating plan  may also help with weight loss. WHAT DO I NEED TO KNOW ABOUT THE DASH EATING PLAN? For the DASH eating plan, you will follow these general guidelines:  Choose foods with a percent daily value for sodium of less than 5% (as listed on the food label).  Use salt-free seasonings or herbs instead of table salt or sea salt.  Check with your health care provider or pharmacist before using salt substitutes.  Eat lower-sodium products, often labeled as "lower sodium" or "no salt added."  Eat fresh foods.  Eat more vegetables, fruits, and low-fat dairy products.  Choose whole grains. Look for the word "whole" as the first word in the ingredient list.  Choose fish and skinless chicken or [place] more often than red meat. Limit fish, poultry, and meat to 6 oz (170 g) each day.  Limit sweets, desserts, sugars, and sugary drinks.  Choose heart-healthy fats.  Limit cheese to 1 oz (28 g) per day.  Eat more home-cooked food and less restaurant, buffet, and fast food.  Limit fried foods.  Cook foods using methods other than frying.  Limit canned vegetables. If you do use them, rinse them well to decrease the sodium.  When eating at a restaurant, ask that your food be prepared with less salt, or no salt if possible. WHAT FOODS CAN I EAT? Seek help from a dietitian for individual calorie needs. Grains Whole grain or whole wheat bread. Brown rice. Whole grain or whole wheat pasta. Quinoa, bulgur, and whole  grain cereals. Low-sodium cereals. Corn or whole wheat flour tortillas. Whole grain cornbread. Whole grain crackers. Low-sodium crackers. Vegetables Fresh or frozen vegetables (raw, steamed, roasted, or grilled). Low-sodium or reduced-sodium tomato and vegetable juices. Low-sodium or reduced-sodium tomato sauce and paste. Low-sodium or reduced-sodium canned vegetables.  Fruits All fresh, canned (in natural juice), or frozen fruits. Meat and Other Protein Products Ground beef (85% or leaner),  grass-fed beef, or beef trimmed of fat. Skinless chicken or [place]. Ground chicken or [place]. Pork trimmed of fat. All fish and seafood. Eggs. Dried beans, peas, or lentils. Unsalted nuts and seeds. Unsalted canned beans. Dairy Low-fat dairy products, such as skim or 1% milk, 2% or reduced-fat cheeses, low-fat ricotta or cottage cheese, or plain low-fat yogurt. Low-sodium or reduced-sodium cheeses. Fats and Oils Tub margarines without trans fats. Light or reduced-fat mayonnaise and salad dressings (reduced sodium). Avocado. Safflower, olive, or canola oils. Natural peanut or almond butter. Other Unsalted popcorn and pretzels. The items listed above may not be a complete list of recommended foods or beverages. Contact your dietitian for more options. WHAT FOODS ARE NOT RECOMMENDED? Grains White bread. White pasta. White rice. Refined cornbread. Bagels and croissants. Crackers that contain trans fat. Vegetables Creamed or fried vegetables. Vegetables in a cheese sauce. Regular canned vegetables. Regular canned tomato sauce and paste. Regular tomato and vegetable juices. Fruits Dried fruits. Canned fruit in light or heavy syrup. Fruit juice. Meat and Other Protein Products Fatty cuts of meat. Ribs, chicken wings, bacon, sausage, bologna, salami, chitterlings, fatback, hot dogs, bratwurst, and packaged luncheon meats. Salted nuts and seeds. Canned beans with salt. Dairy Whole or 2% milk, cream, half-and-half, and cream cheese. Whole-fat or sweetened yogurt. Full-fat cheeses or blue cheese. Nondairy creamers and whipped toppings. Processed cheese, cheese spreads, or cheese curds. Condiments Onion and garlic salt, seasoned salt, table salt, and sea salt. Canned and packaged gravies. Worcestershire sauce. Tartar sauce. Barbecue sauce. Teriyaki sauce. Soy sauce, including reduced sodium. Steak sauce. Fish sauce. Oyster sauce. Cocktail sauce. Horseradish. Ketchup and mustard. Meat flavorings and  tenderizers. Bouillon cubes. Hot sauce. Tabasco sauce. Marinades. Taco seasonings. Relishes. Fats and Oils Butter, stick margarine, lard, shortening, ghee, and bacon fat. Coconut, palm kernel, or palm oils. Regular salad dressings. Other Pickles and olives. Salted popcorn and pretzels. The items listed above may not be a complete list of foods and beverages to avoid. Contact your dietitian for more information. WHERE CAN I FIND MORE INFORMATION? National Heart, Lung, and Blood Institute: travelstabloid.com Document Released: 05/11/2011 Document Revised: 10/06/2013 Document Reviewed: 03/26/2013 [facility] Patient Information 2015 [place], [place]. This information is not intended to replace advice given to you by your health care provider. Make sure you discuss any questions you have with your health care provider.

## 2014-08-31 ENCOUNTER — Ambulatory Visit: Payer: Medicare Other | Admitting: Internal Medicine

## 2014-09-01 ENCOUNTER — Ambulatory Visit: Payer: Medicare Other | Attending: Internal Medicine | Admitting: Internal Medicine

## 2014-09-01 ENCOUNTER — Encounter: Payer: Self-pay | Admitting: Internal Medicine

## 2014-09-01 VITALS — BP 147/84 | HR 73 | Temp 98.0°F | Resp 16 | Ht 66.0 in | Wt 148.0 lb

## 2014-09-01 DIAGNOSIS — R1084 Generalized abdominal pain: Secondary | ICD-10-CM

## 2014-09-01 DIAGNOSIS — I10 Essential (primary) hypertension: Secondary | ICD-10-CM | POA: Diagnosis present

## 2014-09-01 DIAGNOSIS — R5383 Other fatigue: Secondary | ICD-10-CM | POA: Diagnosis not present

## 2014-09-01 LAB — POCT URINALYSIS DIPSTICK
BILIRUBIN UA: NEGATIVE
Glucose, UA: NEGATIVE
Ketones, UA: NEGATIVE
Leukocytes, UA: NEGATIVE
NITRITE UA: NEGATIVE
PROTEIN UA: NEGATIVE
RBC UA: NEGATIVE
Spec Grav, UA: <=1.005
Urobilinogen, UA: 0.2
pH, UA: 6.5

## 2014-09-01 MED ORDER — HYDROCHLOROTHIAZIDE 12.5 MG PO TABS: 25.0000 mg | ORAL_TABLET | Freq: Every day | ORAL | Status: DC

## 2014-09-01 NOTE — Progress Notes (Signed)
Patient ID: [name] [name], male   DOB: 12-04-1960, 54 y.o.   MRN: [id number]   [id number]  [id number]  DOB - 02/22/1961  CC:  Chief Complaint  Patient presents with  . Establish Care       HPI: [name] [name] is a 54 y.o. male here today to establish medical care. She has past medical history of HTN, HLD, depression, anxiety.  He reports that he was seen in the ER 3 weeks ago and was told that he has stomach ulcers. He states that he has not drank alcohol in 2-3 months. History. of 2 quarts per day alcohol consumption. He has a history of a left groin hernia that was repaired over 10 years ago. He notes some continued tenderness in that area.. He states that when he eats his stomach swells and he gets acid reflux even with protonix use.  Feels constipated. He reports that he drinks lots of water per day and has changed his diet to baked foods. He does not eat a lot of veggies.   He takes his BP medication daily, without any side effects.  .  Father passed away from colon cancer in 2003. He has never had colonoscopy  No Known Allergies Past Medical History  Diagnosis Date  . Hypertension   . Hyperlipidemia   . GERD (gastroesophageal reflux disease)   . Seasonal allergies   . Depression   . Anxiety   . Panic   . Insomnia   . Erectile dysfunction    Current Outpatient Prescriptions on File Prior to Visit  Medication Sig Dispense Refill  . HYDROcodone-acetaminophen (NORCO/VICODIN) 5-325 MG per tablet Take 1 tablet by mouth every 6 (six) hours as needed for moderate pain. 15 tablet 0  . lisinopril (PRINIVIL,ZESTRIL) 20 MG tablet Take 1 tablet (20 mg total) by mouth daily. 90 tablet 3  . mometasone (NASONEX) 50 MCG/ACT nasal spray Place 2 sprays into the nose daily as needed (congestion).    . pantoprazole (PROTONIX) 40 MG tablet Take 1 tablet (40 mg total) by mouth daily. 30 tablet 3  . sucralfate (CARAFATE) 1 G tablet Take 1 tablet (1 g total) by mouth 4 (four) times daily.  30 tablet 0  . promethazine (PHENERGAN) 25 MG tablet Take 1 tablet (25 mg total) by mouth every 8 (eight) hours as needed for nausea or vomiting. (Patient not taking: Reported on 09/01/2014) 15 tablet 0  . sucralfate (CARAFATE) 1 GM/10ML suspension Take 10 mLs (1 g total) by mouth 4 (four) times daily -  with meals and at bedtime. (Patient not taking: Reported on 08/24/2014) 420 mL 0   No current facility-administered medications on file prior to visit.   Family History  Problem Relation Age of Onset  . Hypertension Mother   . Stroke Mother   . Cancer Father    History   Social History  . Marital Status: Single    Spouse Name: N/A  . Number of Children: N/A  . Years of Education: N/A   Occupational History  . Not on file.   Social History Main Topics  . Smoking status: Never Smoker   . Smokeless tobacco: Never Used  . Alcohol Use: No  . Drug Use: No  . Sexual Activity: Not on file   Other Topics Concern  . Not on file   Social History Narrative    Review of Systems  Cardiovascular: Positive for leg swelling.  Gastrointestinal: Positive for heartburn, abdominal pain and constipation. Negative for nausea and vomiting.  Musculoskeletal: Positive for joint pain.  All other systems reviewed and are negative.     Objective:   Filed Vitals:   09/01/14 1608  BP: 147/84  Pulse: 73  Temp: 98 F (36.7 C)  Resp: 16    Physical Exam: Constitutional: Patient appears well-developed and well-nourished. No distress. HENT: Normocephalic, atraumatic, External right and left ear normal. Oropharynx is clear and moist.  Eyes: Conjunctivae and EOM are normal. PERRLA, no scleral icterus. Neck: Normal ROM. Neck supple. No JVD. No tracheal deviation. No thyromegaly. CVS: RRR, S1/S2 +, no murmurs, no gallops, no carotid bruit.  Pulmonary: Effort and breath sounds normal, no stridor, rhonchi, wheezes, rales.  Abdominal: Soft. BS +, no distension, tenderness, rebound or guarding.    Musculoskeletal: Normal range of motion. No edema and no tenderness.  Lymphadenopathy: No lymphadenopathy noted, cervical Neuro: Alert. Normal reflexes, muscle tone coordination. No cranial nerve deficit. Skin: Skin is warm and dry. No rash noted. Not diaphoretic. No erythema. No pallor. Psychiatric: Normal mood and affect. Behavior, judgment, thought content normal.  Lab Results  Component Value Date   WBC 5.6 08/04/2014   HGB 12.6* 08/04/2014   HCT 36.9* 08/04/2014   MCV 94.6 08/04/2014   PLT 243 08/04/2014   Lab Results  Component Value Date   CREATININE 0.92 08/04/2014   BUN 9 08/04/2014   NA 141 08/04/2014   K 3.9 08/04/2014   CL 108 08/04/2014   CO2 25 08/04/2014    No results found for: HGBA1C Lipid Panel  No results found for: CHOL, TRIG, HDL, CHOLHDL, VLDL, LDLCALC     Assessment and plan:   Aleister was seen today for establish care.  Diagnoses and all orders for this visit:  Essential hypertension Orders: -     hydrochlorothiazide (HYDRODIURIL) 12.5 MG tablet; Take 2 tablets (25 mg total) by mouth daily. Added HCTZ with lisinopril for better control.   Explained to patient diet modifications that may contribute to elevated BP. Patient will avoid foods that are high in sodium such as  canned soups and vegetables, tomato juice, commercial baked goods, commercially prepared frozen or canned entrees and sauces. Avoid salty snacks, added salt when cooking, and substituting for low sodium herbs or spices Explained exercise regimen of cardio at least three times weekly to help lower BP and cholesterol  Other fatigue Orders: -     TSH -     Testosterone  Generalized abdominal pain Orders: -     POCT urinalysis dipstick -     HM COLONOSCOPY He already has appit with [facility] next month. He will need colonoscopy as well.   Return in about 2 weeks (around 09/15/2014) for Nurse Visit-BP check and 3 mo PCP HTN.      Chari Manning, NP-C [facility] [phone] 09/01/2014, 4:46 PM

## 2014-09-01 NOTE — Patient Instructions (Signed)
You will continue lisinopril and begin on new regimen for BP. This will help with headaches, swelling, and dizziness.   Please keep your GI appointment.

## 2014-09-01 NOTE — Progress Notes (Signed)
Pt is here to establish care. Pt is here to c/o of a severe headache that started yesterday. Pt also has been c/o pain in the abdomen for several months.

## 2014-09-02 LAB — TESTOSTERONE: TESTOSTERONE: 138 ng/dL — AB (ref 300–890)

## 2014-09-02 LAB — TSH: TSH: 0.464 u[IU]/mL (ref 0.350–4.500)

## 2014-09-04 ENCOUNTER — Ambulatory Visit: Payer: Medicare Other | Attending: Internal Medicine

## 2014-09-04 DIAGNOSIS — I10 Essential (primary) hypertension: Secondary | ICD-10-CM

## 2014-09-04 LAB — LIPID PANEL
CHOL/HDL RATIO: 5.8 ratio
CHOLESTEROL: 248 mg/dL — AB (ref 0–200)
HDL: 43 mg/dL (ref >=40)
LDL Cholesterol: 164 mg/dL — ABNORMAL HIGH (ref 0–99)
Triglycerides: 205 mg/dL — ABNORMAL HIGH (ref <150)
VLDL: 41 mg/dL — AB (ref 0–40)

## 2014-09-14 ENCOUNTER — Telehealth: Payer: Self-pay | Admitting: *Deleted

## 2014-09-14 NOTE — Telephone Encounter (Signed)
Pt called is returning nurse's phone call, please f/u with pt

## 2014-09-14 NOTE — Telephone Encounter (Signed)
Left HIPAA compliant message on patient's mobile VM to return call  Home number states not accepting calls at this time.

## 2014-09-14 NOTE — Telephone Encounter (Signed)
-----   Message from Lance Bosch, NP sent at 09/11/2014 10:20 PM EDT ----- Testosterone is substantially low, which is likely the cause of his fatigue. Explain to him that we may begin testosterone injections once monthly for 3 months and then recheck leels to see if they have improved. If he agrees you may send order for Depo testosterone 200 mg IM and place on Nurse schedule for administration.

## 2014-09-15 ENCOUNTER — Telehealth: Payer: Self-pay | Admitting: Internal Medicine

## 2014-09-15 MED ORDER — TESTOSTERONE CYPIONATE 200 MG/ML IM SOLN: 200.0000 mg | INTRAMUSCULAR | Status: DC

## 2014-09-15 NOTE — Telephone Encounter (Signed)
Attempted to reach patient at mobile number, however, there was no answer and no VM to leave message

## 2014-09-15 NOTE — Telephone Encounter (Signed)
Patient notified of lab result and option Patient would like to begin monthly testosterone injections RX e-scribed to [facility] on [address] at [address] Patient aware that he is to p/u med at pharmacy and bring to appt  Patient given appt for nurse visit 09/17/14 at 1500

## 2014-09-15 NOTE — Telephone Encounter (Signed)
Patient is returning nurses call, please f/u with pt.

## 2014-09-15 NOTE — Telephone Encounter (Signed)
Pt requesting medication that was prescribed to day to be sent to [facility] on [address] and [address]. Please f/u with pt.

## 2014-09-16 ENCOUNTER — Telehealth: Payer: Self-pay | Admitting: Internal Medicine

## 2014-09-16 ENCOUNTER — Other Ambulatory Visit: Payer: Self-pay | Admitting: Internal Medicine

## 2014-09-16 ENCOUNTER — Other Ambulatory Visit: Payer: Self-pay | Admitting: *Deleted

## 2014-09-16 DIAGNOSIS — R7989 Other specified abnormal findings of blood chemistry: Secondary | ICD-10-CM

## 2014-09-16 MED ORDER — TESTOSTERONE CYPIONATE 200 MG/ML IM SOLN: 200.0000 mg | INTRAMUSCULAR | Status: DC

## 2014-09-16 NOTE — Telephone Encounter (Signed)
Prescription has been printed he may pick up. Thanks

## 2014-09-16 NOTE — Telephone Encounter (Signed)
Patient would like his medication sent to [facility] on [address] and [address].

## 2014-09-16 NOTE — Telephone Encounter (Signed)
Patient called requesting medication testosterone cypionate (DEPO-TESTOSTERONE) 200 MG/ML injection to be sent to [facility] on [address]. Please f/u with pt he will need it for nurse's appointment

## 2014-09-17 ENCOUNTER — Ambulatory Visit: Payer: Medicare Other | Attending: Internal Medicine | Admitting: *Deleted

## 2014-09-17 DIAGNOSIS — R7989 Other specified abnormal findings of blood chemistry: Secondary | ICD-10-CM

## 2014-09-17 DIAGNOSIS — E291 Testicular hypofunction: Secondary | ICD-10-CM

## 2014-09-17 MED ORDER — TESTOSTERONE CYPIONATE 100 MG/ML IM SOLN
200.0000 mg | INTRAMUSCULAR | Status: AC
  Administered 2014-09-17 – 2014-11-17 (×3): 200 mg via INTRAMUSCULAR

## 2014-09-17 NOTE — Progress Notes (Signed)
Patient presents with wife for first Depo Testosterone injection States feeling better from last OV  Next injection due 10/15/2014

## 2014-09-17 NOTE — Patient Instructions (Signed)

## 2014-09-21 ENCOUNTER — Telehealth: Payer: Self-pay | Admitting: *Deleted

## 2014-09-21 MED ORDER — ATORVASTATIN CALCIUM 20 MG PO TABS: 20.0000 mg | ORAL_TABLET | Freq: Every day | ORAL | Status: DC

## 2014-09-21 NOTE — Telephone Encounter (Signed)
Pt is aware of hid lab results. Medications sent to his pharmacy.

## 2014-09-21 NOTE — Telephone Encounter (Signed)
-----   Message from Lance Bosch, NP sent at 09/16/2014  6:01 PM EDT ----- Cholesterol is elevated. Please send atorvastatin 20 mg to take daily. Please educate patient on importance of strict cholesterol control, diet and exercise.

## 2014-09-22 ENCOUNTER — Other Ambulatory Visit: Payer: Self-pay | Admitting: Internal Medicine

## 2014-09-22 ENCOUNTER — Other Ambulatory Visit: Payer: Self-pay | Admitting: Emergency Medicine

## 2014-09-22 ENCOUNTER — Ambulatory Visit: Payer: Medicare Other | Attending: Internal Medicine | Admitting: *Deleted

## 2014-09-22 VITALS — BP 108/67 | HR 85 | Temp 97.9°F | Resp 14

## 2014-09-22 DIAGNOSIS — I10 Essential (primary) hypertension: Secondary | ICD-10-CM | POA: Diagnosis not present

## 2014-09-22 MED ORDER — HYDROCHLOROTHIAZIDE 12.5 MG PO TABS: 12.5000 mg | ORAL_TABLET | Freq: Every day | ORAL | Status: DC

## 2014-09-22 NOTE — Patient Instructions (Signed)

## 2014-09-22 NOTE — Progress Notes (Signed)
Patient presents for BP check Med list reviewed; states taking all meds as directed except taking 1 tab of HCTZ 12.5 mg Discussed need for low sodium diet and using Mrs. Dash as alternative to salt Encouraged to choose foods with 5% or less of daily value for sodium. Patient walking 60 minutes per day for exercise Patient denies blurred vision, SHOB, chest pain or pressure C/o sinus headache since yesterday  Filed Vitals:   09/22/14 1553  BP: 108/67  Pulse: 85  Temp: 97.9 F (36.6 C)  Resp: 14    Per PCP: Keep HCTZ at 12.5 mg  Patient advised to call for med refills at least 7 days before running out so as not to go without.  Patient aware that he is to f/u with PCP 3 months from last visit (Due 12/02/14)  Patient given literature on DASH Eating Plan Fat and Cholesterol Control Diet

## 2014-09-23 ENCOUNTER — Ambulatory Visit (INDEPENDENT_AMBULATORY_CARE_PROVIDER_SITE_OTHER): Payer: Medicare Other | Admitting: Gastroenterology

## 2014-09-23 ENCOUNTER — Telehealth: Payer: Self-pay | Admitting: Gastroenterology

## 2014-09-23 ENCOUNTER — Encounter: Payer: Self-pay | Admitting: Gastroenterology

## 2014-09-23 VITALS — BP 110/76 | HR 80 | Ht 66.0 in | Wt 146.4 lb

## 2014-09-23 DIAGNOSIS — R933 Abnormal findings on diagnostic imaging of other parts of digestive tract: Secondary | ICD-10-CM

## 2014-09-23 DIAGNOSIS — K5909 Other constipation: Secondary | ICD-10-CM

## 2014-09-23 DIAGNOSIS — R1084 Generalized abdominal pain: Secondary | ICD-10-CM | POA: Diagnosis not present

## 2014-09-23 NOTE — Telephone Encounter (Signed)
No answer/machine. I will try call later

## 2014-09-23 NOTE — Patient Instructions (Signed)
You have been scheduled for an endoscopy. Please follow written instructions given to you at your visit today. If you use inhalers (even only as needed), please bring them with you on the day of your procedure.  You can use Miralax mixing 17 grams in 8 oz of water 1-2 x daily to titrate depending on bowel movements.  Thank you for choosing me and [facility].  Pricilla Riffle. Dagoberto Ligas., MD., Marval Regal  cc: Sharon Seller, NP

## 2014-09-23 NOTE — Progress Notes (Addendum)
    History of Present Illness: This is a 54 year old make referred by Micheline Chapman, NP for the evaluation of abdominal pain and an abnormal CT of the stomach and colon. Patient relates ongoing problems with constipation, generalized abdominal pain and bloating. He has intermittent epigastric pain and reflux symptoms that appeared to be under better control on pantoprazole. He's been evaluated in the ED on several occasions and a CT scan from February had the below findings. Patient states he has been followed by Dr. Verdia Kuba and underwent colonoscopy in 2013 that he thought was normal. Denies weight loss, diarrhea, change in stool caliber, melena, hematochezia, nausea, vomiting, dysphagia.   IMPRESSION: 1. Two areas of possible acute intestinal inflammation: Gastric antrum (such as due to peptic ulcer disease), and splenic flexure (such as with infectious colitis). No associated abdominal fluid or fat stranding in these areas.    Review of Systems: Pertinent positive and negative review of systems were noted in the above HPI section. All other review of systems were otherwise negative.  Current Medications, Allergies, Past Medical History, Past Surgical History, Family History and Social History were reviewed in [employer] record.  Physical Exam: General: Well developed, well nourished, no acute distress Head: Normocephalic and atraumatic Eyes:  sclerae anicteric, EOMI Ears: Normal auditory acuity Mouth: No deformity or lesions Neck: Supple, no masses or thyromegaly Lungs: Clear throughout to auscultation Heart: Regular rate and rhythm; no murmurs, rubs or bruits Abdomen: Soft, mild diffuse tenderness and mildly distended. No masses, hepatosplenomegaly or hernias noted. Normal Bowel sounds Musculoskeletal: Symmetrical with no gross deformities  Skin: No lesions on visible extremities Pulses:  Normal pulses noted Extremities: No clubbing, cyanosis, edema or  deformities noted Neurological: Alert oriented x 4, grossly nonfocal Cervical Nodes:  No significant cervical adenopathy Inguinal Nodes: No significant inguinal adenopathy Psychological:  Alert and cooperative. Normal mood and affect  Assessment and Recommendations:  1. Generalized abdominal pain/bloating, constipation, abnormal CT of stomach and colon. I suspect the splenic flexure abnormality was a self-limited process. Abdominal pain and bloating appear to be related to constipation. Rule out antral gastritis or antral ulcer. Continue pantoprazole 40 mg daily. Miralax qd to bid titrated for adequate bowel movements. DC Carafate. Avoid ASA/NSAIDs. Request records from prior colonoscopy. The risks (including bleeding, perforation, infection, missed lesions, medication reactions and possible hospitalization or surgery if complications occur), benefits, and alternatives to endoscopy with possible biopsy and possible dilation were discussed with the patient and they consent to proceed.    cc: Micheline Chapman, NP [address] [place], [place] [id number]    09/28/2014 Dr. Ulyses Amor records received and reviewed. Pt had bloating and GERD. He had a normal colonoscopy, except for internal hemorrhoids, on 02/09/2012 and normal EGD on 02/09/2012.

## 2014-09-24 MED ORDER — POLYETHYLENE GLYCOL 3350 17 GM/SCOOP PO POWD: 1.0000 | ORAL | Status: DC | PRN

## 2014-09-24 NOTE — Telephone Encounter (Signed)
Patient was seen in the office yesterday by Dr. Fuller Plan.  It was recommended he start on Miralx 1-2 times a day. No answer/machine

## 2014-09-24 NOTE — Telephone Encounter (Signed)
Prescription for miralax sent to pharmacy.

## 2014-10-02 ENCOUNTER — Encounter: Payer: Self-pay | Admitting: Gastroenterology

## 2014-10-02 ENCOUNTER — Ambulatory Visit (AMBULATORY_SURGERY_CENTER): Payer: Medicare Other | Admitting: Gastroenterology

## 2014-10-02 ENCOUNTER — Telehealth: Payer: Self-pay | Admitting: Internal Medicine

## 2014-10-02 VITALS — BP 120/75 | HR 75 | Temp 98.4°F | Resp 18 | Ht 66.0 in | Wt 146.0 lb

## 2014-10-02 DIAGNOSIS — R1084 Generalized abdominal pain: Secondary | ICD-10-CM

## 2014-10-02 DIAGNOSIS — K2950 Unspecified chronic gastritis without bleeding: Secondary | ICD-10-CM

## 2014-10-02 DIAGNOSIS — R933 Abnormal findings on diagnostic imaging of other parts of digestive tract: Secondary | ICD-10-CM

## 2014-10-02 MED ORDER — SODIUM CHLORIDE 0.9 % IV SOLN: 500.0000 mL | INTRAVENOUS | Status: DC

## 2014-10-02 NOTE — Patient Instructions (Signed)
YOU HAD AN ENDOSCOPIC PROCEDURE TODAY AT [facility]:   Refer to the procedure report that was given to you for any specific questions about what was found during the examination.  If the procedure report does not answer your questions, please call your gastroenterologist to clarify.  If you requested that your care partner not be given the details of your procedure findings, then the procedure report has been included in a sealed envelope for you to review at your convenience later.  YOU SHOULD EXPECT: Some feelings of bloating in the abdomen. Passage of more gas than usual.  Walking can help get rid of the air that was put into your GI tract during the procedure and reduce the bloating. If you had a lower endoscopy (such as a colonoscopy or flexible sigmoidoscopy) you may notice spotting of blood in your stool or on the toilet paper. If you underwent a bowel prep for your procedure, you may not have a normal bowel movement for a few days.  Please Note:  You might notice some irritation and congestion in your nose or some drainage.  This is from the oxygen used during your procedure.  There is no need for concern and it should clear up in a day or so.  SYMPTOMS TO REPORT IMMEDIATELY:   Following upper endoscopy (EGD)  Vomiting of blood or coffee ground material  New chest pain or pain under the shoulder blades  Painful or persistently difficult swallowing  New shortness of breath  Fever of 100F or higher  Black, tarry-looking stools  For urgent or emergent issues, a gastroenterologist can be reached at any hour by calling [phone].   DIET: Your first meal following the procedure should be a small meal and then it is ok to progress to your normal diet. Heavy or fried foods are harder to digest and may make you feel nauseous or bloated.  Likewise, meals heavy in dairy and vegetables can increase bloating.  Drink plenty of fluids but you should avoid alcoholic beverages for  24 hours.  ACTIVITY:  You should plan to take it easy for the rest of today and you should NOT DRIVE or use heavy machinery until tomorrow (because of the sedation medicines used during the test).    FOLLOW UP: Our staff will call the number listed on your records the next business day following your procedure to check on you and address any questions or concerns that you may have regarding the information given to you following your procedure. If we do not reach you, we will leave a message.  However, if you are feeling well and you are not experiencing any problems, there is no need to return our call.  We will assume that you have returned to your regular daily activities without incident.  If any biopsies were taken you will be contacted by phone or by letter within the next 1-3 weeks.  Please call us at [phone] if you have not heard about the biopsies in 3 weeks.    SIGNATURES/CONFIDENTIALITY: You and/or your care partner have signed paperwork which will be entered into your electronic medical record.  These signatures attest to the fact that that the information above on your After Visit Summary has been reviewed and is understood.  Full responsibility of the confidentiality of this discharge information lies with you and/or your care-partner.  Recommendations Discharge instructions given to patient and/or care partner. Gastritis handout provided.

## 2014-10-02 NOTE — Progress Notes (Signed)
Stable to RR 

## 2014-10-02 NOTE — Progress Notes (Signed)
Called to room to assist during endoscopic procedure.  Patient ID and intended procedure confirmed with present staff. Received instructions for my participation in the procedure from the performing physician.  

## 2014-10-02 NOTE — Telephone Encounter (Signed)
Having neck pain x 6 days, into arm on right. ? If ok for EGD today. No parasthesia, weakness. No chest pain, dyspnea. Otherwise he is ok. I told him to check with PCP about sxs. Seems ok for procedure - unless other info comes to lioght when he presents. He will come for EGD.

## 2014-10-02 NOTE — Op Note (Signed)
[address] [place], [address]   ENDOSCOPY PROCEDURE REPORT  PATIENT: [name] [name]  MR#: [id number] BIRTHDATE: [birth year] , 89  yrs. old GENDER: male ENDOSCOPIST: Ladene Artist, MD, [facility] REFERRED BY:     Sharon Seller, NP PROCEDURE DATE:  10/02/2014 PROCEDURE:  EGD w/ biopsy ASA CLASS:     Class III INDICATIONS:  abnormal CT of the GI tract and abdominal pain. MEDICATIONS: Monitored anesthesia care, Propofol 100 mg IV, and lidocaine 40 mg IV TOPICAL ANESTHETIC: none DESCRIPTION OF PROCEDURE: After the risks benefits and alternatives of the procedure were thoroughly explained, informed consent was obtained.  The LB [id number] endoscope was introduced through the mouth and advanced to the second portion of the duodenum , Without limitations.  The instrument was slowly withdrawn as the mucosa was fully examined.    ESOPHAGUS: The mucosa of the esophagus appeared normal. STOMACH: Gastritis was found in the gastric body and gastric antrum. Multiple biopsies were performed.   The stomach otherwise appeared normal. DUODENUM: The duodenal mucosa showed no abnormalities in the bulb and 2nd part of the duodenum.  Retroflexed views revealed no abnormalities.     The scope was then withdrawn from the patient and the procedure completed.  COMPLICATIONS: There were no immediate complications.  ENDOSCOPIC IMPRESSION: 1.   Gastritis in the gastric body and gastric antrum; multiple biopsies performed 2.   The EGD otherwise appeared normal  RECOMMENDATIONS: 1.  Await pathology results 2.  Continue PPI daily long term 3.  Follow-up appointment with your primary MD as planned  eSigned:  Ladene Artist, MD, [facility] 10/02/2014 1:59 PM

## 2014-10-05 ENCOUNTER — Telehealth: Payer: Self-pay | Admitting: *Deleted

## 2014-10-05 NOTE — Telephone Encounter (Signed)
  Follow up Call-  Call back number 10/02/2014  Post procedure Call Back phone  # [phone]  Permission to leave phone message Yes     Patient questions:  Do you have a fever, pain , or abdominal swelling? No. Pain Score  0 *  Have you tolerated food without any problems? Yes.    Have you been able to return to your normal activities? Yes.    Do you have any questions about your discharge instructions: Diet   No. Medications  No. Follow up visit  No.  Do you have questions or concerns about your Care? No.  Actions: * If pain score is 4 or above: No action needed, pain <4.

## 2014-10-08 ENCOUNTER — Encounter: Payer: Self-pay | Admitting: Gastroenterology

## 2014-10-15 ENCOUNTER — Ambulatory Visit: Payer: Medicare Other | Attending: Internal Medicine | Admitting: *Deleted

## 2014-10-15 DIAGNOSIS — J301 Allergic rhinitis due to pollen: Secondary | ICD-10-CM | POA: Diagnosis not present

## 2014-10-15 DIAGNOSIS — E291 Testicular hypofunction: Secondary | ICD-10-CM | POA: Insufficient documentation

## 2014-10-15 DIAGNOSIS — R7989 Other specified abnormal findings of blood chemistry: Secondary | ICD-10-CM

## 2014-10-15 MED ORDER — MOMETASONE FUROATE 50 MCG/ACT NA SUSP: 2.0000 | Freq: Every day | NASAL | Status: DC | PRN

## 2014-10-15 NOTE — Progress Notes (Signed)
Patient presents for Depo testosterone injection States feeling well except nasal congestion secondary to seasonal pollen allergies and 2 week hx right arm pain Patient states he's been using oxymetazoline for nasal congestion. Advised patient that med is for short term use only (3-4 days) and not for chronic nasal congestion. Refill sent for nasonex to use instead Last injection received 09/17/14 Next injection due 11/12/2014 Advised patient to schedule appt with PCP for complaints listed above

## 2014-10-16 ENCOUNTER — Telehealth: Payer: Self-pay | Admitting: Internal Medicine

## 2014-10-16 NOTE — Telephone Encounter (Signed)
Pt is requesting change in prescription for nasal spray because his insurance will not cover cost which is approx $200 out of pocket, please f/u with pt as soon as possible

## 2014-11-12 ENCOUNTER — Ambulatory Visit: Payer: Medicare Other

## 2014-11-13 ENCOUNTER — Ambulatory Visit: Payer: Medicare Other

## 2014-11-17 ENCOUNTER — Ambulatory Visit: Payer: Medicare Other | Attending: Internal Medicine | Admitting: *Deleted

## 2014-11-17 VITALS — BP 105/66 | HR 82 | Temp 97.8°F | Resp 16 | Ht 66.0 in | Wt 152.6 lb

## 2014-11-17 DIAGNOSIS — E291 Testicular hypofunction: Secondary | ICD-10-CM

## 2014-11-17 DIAGNOSIS — R7989 Other specified abnormal findings of blood chemistry: Secondary | ICD-10-CM

## 2014-11-17 NOTE — Progress Notes (Signed)
Patient walked in for testosterone injection.  Patient brought in his medication and 200 mg was administered in the left deltoid.  Patient tolerated well.

## 2014-12-03 ENCOUNTER — Encounter: Payer: Self-pay | Admitting: Internal Medicine

## 2014-12-03 ENCOUNTER — Ambulatory Visit: Payer: Medicare Other | Attending: Internal Medicine | Admitting: Internal Medicine

## 2014-12-03 VITALS — BP 111/75 | HR 86 | Temp 97.6°F | Resp 16 | Wt 156.4 lb

## 2014-12-03 DIAGNOSIS — E559 Vitamin D deficiency, unspecified: Secondary | ICD-10-CM | POA: Insufficient documentation

## 2014-12-03 DIAGNOSIS — I10 Essential (primary) hypertension: Secondary | ICD-10-CM | POA: Diagnosis not present

## 2014-12-03 DIAGNOSIS — M79671 Pain in right foot: Secondary | ICD-10-CM | POA: Diagnosis not present

## 2014-12-03 DIAGNOSIS — K219 Gastro-esophageal reflux disease without esophagitis: Secondary | ICD-10-CM | POA: Insufficient documentation

## 2014-12-03 DIAGNOSIS — E785 Hyperlipidemia, unspecified: Secondary | ICD-10-CM | POA: Insufficient documentation

## 2014-12-03 DIAGNOSIS — R5383 Other fatigue: Secondary | ICD-10-CM | POA: Insufficient documentation

## 2014-12-03 DIAGNOSIS — K921 Melena: Secondary | ICD-10-CM | POA: Insufficient documentation

## 2014-12-03 MED ORDER — PANTOPRAZOLE SODIUM 40 MG PO TBEC: 40.0000 mg | DELAYED_RELEASE_TABLET | Freq: Every day | ORAL | Status: DC

## 2014-12-03 MED ORDER — HYDROCHLOROTHIAZIDE 12.5 MG PO TABS: 12.5000 mg | ORAL_TABLET | Freq: Every day | ORAL | Status: DC

## 2014-12-03 MED ORDER — ATORVASTATIN CALCIUM 20 MG PO TABS: 20.0000 mg | ORAL_TABLET | Freq: Every day | ORAL | Status: DC

## 2014-12-03 MED ORDER — SUCRALFATE 1 G PO TABS
1.0000 g | ORAL_TABLET | Freq: Three times a day (TID) | ORAL | Status: DC
Start: 2014-12-03 — End: 2018-04-15

## 2014-12-03 MED ORDER — TRAMADOL HCL 50 MG PO TABS: 50.0000 mg | ORAL_TABLET | Freq: Two times a day (BID) | ORAL | Status: DC | PRN

## 2014-12-03 MED ORDER — LISINOPRIL 20 MG PO TABS: 20.0000 mg | ORAL_TABLET | Freq: Every day | ORAL | Status: DC

## 2014-12-03 NOTE — Progress Notes (Signed)
  New patient here to discuss Hypertension. Pt states he has been having swelling in his feet and joint pain for a couple of months now.

## 2014-12-03 NOTE — Progress Notes (Signed)
Patient ID: [name] [name], male   DOB: Jan 12, 1961, 54 y.o.   MRN: [id number] Subjective:  [name] [name] is a 54 y.o. male with hypertension. Patient reports that he has been having swelling in his feet and joint pain for a couple of months now. The pain/swelling is only in his right foot for one month. He states that the pain begins in his right toe and radiates up his leg. The pain is achy and he sometimes feel like it is hard to walk or bend his toe.   Patient also c/o feeling weak and tired. He has noticed some blood in his stools. He states that he is often constipated. Some abdominal pain. He reports that he has had 3 stools with blood over past couple of days. He reports taking protonix daily since finding out he had stomach ulcers. Denies nausea or vomiting.    Current Outpatient Prescriptions  Medication Sig Dispense Refill  . atorvastatin (LIPITOR) 20 MG tablet Take 1 tablet (20 mg total) by mouth daily. 90 tablet 3  . hydrochlorothiazide (HYDRODIURIL) 12.5 MG tablet Take 1 tablet (12.5 mg total) by mouth daily. 30 tablet 3  . HYDROcodone-acetaminophen (NORCO/VICODIN) 5-325 MG per tablet Take 1 tablet by mouth every 6 (six) hours as needed for moderate pain. 15 tablet 0  . lisinopril (PRINIVIL,ZESTRIL) 20 MG tablet Take 1 tablet (20 mg total) by mouth daily. 90 tablet 3  . mometasone (NASONEX) 50 MCG/ACT nasal spray Place 2 sprays into the nose daily as needed (congestion). 17 g 2  . pantoprazole (PROTONIX) 40 MG tablet Take 1 tablet (40 mg total) by mouth daily. 30 tablet 3  . polyethylene glycol powder (GLYCOLAX/MIRALAX) powder Take 255 g by mouth as needed. 255 g 0  . promethazine (PHENERGAN) 25 MG tablet Take 1 tablet (25 mg total) by mouth every 8 (eight) hours as needed for nausea or vomiting. 15 tablet 0  . sucralfate (CARAFATE) 1 G tablet Take 1 tablet (1 g total) by mouth 4 (four) times daily. 30 tablet 0   Current Facility-Administered Medications  Medication Dose Route  Frequency Provider Last Rate Last Dose  . testosterone cypionate (DEPOTESTOTERONE CYPIONATE) injection 200 mg  200 mg Intramuscular Q28 days Lance Bosch, NP   200 mg at 11/17/14 1512    Hypertension ROS: taking medications as instructed, no medication side effects noted, no TIA's, no chest pain on exertion, no dyspnea on exertion, no swelling of ankles and no palpitations. + blood in stools, epigastric discomfort, foot pain/ swelling  Objective:  BP 111/75 mmHg  Pulse 86  Temp(Src) 97.6 F (36.4 C)  Resp 16  Wt 156 lb 6.4 oz (70.943 kg)  SpO2 96%  Appearance alert, well appearing, and in no distress and oriented to person, place, and time. General exam BP noted to be well controlled today in office, S1, S2 normal, no gallop, no murmur, chest clear, no JVD, no HSM, no edema.  Lab review: orders written for new lab studies as appropriate; see orders.   [name] [name] was seen today for new patient.  Diagnoses and all orders for this visit:  Essential hypertension Patient blood pressure is stable and may continue on current medication.  Education on diet, exercise, and modifiable risk factors discussed. Will obtain appropriate labs as needed. Will follow up in 3-6 months.   Gastroesophageal reflux disease, esophagitis presence not specified Orders: -     sucralfate (CARAFATE) 1 G tablet; Take 1 tablet (1 g total) by mouth 4 (four) times daily -  with meals and at bedtime. Patient reports GI pain, I have refilled his carafate for better management of symptoms.   Blood in stool Orders: -     CBC Patient will call GI to make a appointment with GI. I have reminded him to take Protonix daily. His last colonoscopy was in 2013 which just revealed hemorrhoids.   HLD Education provided on proper lifestyle changes in order to lower cholesterol. Patient advised to maintain healthy weight and to keep total fat intake at 25-35% of total calories and carbohydrates 50-60% of total daily calories.  Explained how high cholesterol places patient at risk for heart disease. Patient placed on appropriate medication and repeat labs in 6 months   Right foot pain Orders: -     Uric Acid Will call patient with results. If he is having a acute attack I will send him colchicine and prednisone  Other fatigue Orders: -     Testosterone -     CBC Patient is already receiving testosterone injections and has received 3. I will recheck levels today but he continues to report fatigue.   Vitamin D deficiency Orders: -     Vitamin D, 25-hydroxy  Return in about 3 months (around 03/05/2015) for Hypertension.  Chari Manning, NP 12/03/2014 5:03 PM

## 2014-12-03 NOTE — Patient Instructions (Addendum)
I will call you in a few days to let you know if it is gout that is causing your foot pain   The number to [facility] is [phone]. Please call tomorrow and make them aware that you are having blood in your stool

## 2014-12-04 ENCOUNTER — Telehealth: Payer: Self-pay | Admitting: General Practice

## 2014-12-04 LAB — URIC ACID: Uric Acid, Serum: 8.4 mg/dL — ABNORMAL HIGH (ref 4.0–7.8)

## 2014-12-04 LAB — CBC
HCT: 44.5 % (ref 39.0–52.0)
Hemoglobin: 14.8 g/dL (ref 13.0–17.0)
MCH: 31 pg (ref 26.0–34.0)
MCHC: 33.3 g/dL (ref 30.0–36.0)
MCV: 93.1 fL (ref 78.0–100.0)
MPV: 9.4 fL (ref 8.6–12.4)
PLATELETS: 216 10*3/uL (ref 150–400)
RBC: 4.78 MIL/uL (ref 4.22–5.81)
RDW: 13.3 % (ref 11.5–15.5)
WBC: 5.6 10*3/uL (ref 4.0–10.5)

## 2014-12-04 LAB — TESTOSTERONE: Testosterone: 187 ng/dL — ABNORMAL LOW (ref 300–890)

## 2014-12-04 LAB — VITAMIN D 25 HYDROXY (VIT D DEFICIENCY, FRACTURES): Vit D, 25-Hydroxy: 15 ng/mL — ABNORMAL LOW (ref 30–100)

## 2014-12-04 NOTE — Telephone Encounter (Signed)
I spoke to patient he is going to call back [facility] to schedule an appointment because he saw them 09/2014 and valerie put in the notes that patient will follow up with them . thank you

## 2014-12-04 NOTE — Telephone Encounter (Signed)
Patient was seen in clinic on 12/03/14 and was to be referred to GI. Patient states he contacted a GI doctor today and was informed that they did not receive his referral. Please contact patient to advise him which GI doctor he was referred to.

## 2014-12-09 ENCOUNTER — Other Ambulatory Visit: Payer: Self-pay | Admitting: Internal Medicine

## 2014-12-09 DIAGNOSIS — M109 Gout, unspecified: Secondary | ICD-10-CM

## 2014-12-09 MED ORDER — COLCHICINE 0.6 MG PO TABS
0.6000 mg | ORAL_TABLET | Freq: Two times a day (BID) | ORAL | Status: DC
Start: 2014-12-09 — End: 2014-12-11

## 2014-12-09 MED ORDER — INDOMETHACIN 50 MG PO CAPS: 50.0000 mg | ORAL_CAPSULE | Freq: Two times a day (BID) | ORAL | Status: DC

## 2014-12-11 ENCOUNTER — Telehealth: Payer: Self-pay | Admitting: *Deleted

## 2014-12-11 ENCOUNTER — Other Ambulatory Visit: Payer: Self-pay | Admitting: Internal Medicine

## 2014-12-11 ENCOUNTER — Other Ambulatory Visit: Payer: Self-pay | Admitting: *Deleted

## 2014-12-11 DIAGNOSIS — K921 Melena: Secondary | ICD-10-CM

## 2014-12-11 DIAGNOSIS — M109 Gout, unspecified: Secondary | ICD-10-CM

## 2014-12-11 DIAGNOSIS — E559 Vitamin D deficiency, unspecified: Secondary | ICD-10-CM

## 2014-12-11 MED ORDER — ERGOCALCIFEROL 1.25 MG (50000 UT) PO CAPS: 50000.0000 [IU] | ORAL_CAPSULE | ORAL | Status: DC

## 2014-12-11 MED ORDER — COLCHICINE 0.6 MG PO TABS: 0.6000 mg | ORAL_TABLET | Freq: Two times a day (BID) | ORAL | Status: DC

## 2014-12-11 MED ORDER — INDOMETHACIN 50 MG PO CAPS: 50.0000 mg | ORAL_CAPSULE | Freq: Two times a day (BID) | ORAL | Status: DC

## 2014-12-11 NOTE — Telephone Encounter (Signed)
Gave patient results and instructions.  Verified name and date of birth.  Patient, according to PCP last note was to call and make appointment to see GI.  Patient reports when he called they said his PCP needed to put in a referral.  Placed referral for patient.

## 2014-12-11 NOTE — Telephone Encounter (Signed)
Patient called stating medication prescibed is to expensive and insurance will not cover, patient would like new medication to be sent to pharmacy on file. Patient also would like nurse to contact him back has a question about medication. Please f/u

## 2014-12-11 NOTE — Telephone Encounter (Signed)
-----   Message from Lance Bosch, NP sent at 12/09/2014  9:43 AM EDT ----- Patient is having gout flare. I will send him colchicine to take twice daily to help lower the uric acid level in his body. WHILE HE IS TAKING COLCHICINE, PLEASE HAVE HIM TO STOP ATORVASTATIN. He may resume cholesterol med after completing colchicine  Testosterone is still very low, he will need to do 3 more injections and we will recheck level again after that.   Vitamin D is low. I will send drisdol 50,000 IU to take once weekly for 10 weeks.  This will help with bone health and energy.

## 2014-12-25 ENCOUNTER — Other Ambulatory Visit: Payer: Self-pay | Admitting: Internal Medicine

## 2014-12-25 DIAGNOSIS — M109 Gout, unspecified: Secondary | ICD-10-CM

## 2014-12-25 MED ORDER — DICLOFENAC SODIUM 75 MG PO TBEC: 75.0000 mg | DELAYED_RELEASE_TABLET | Freq: Two times a day (BID) | ORAL | Status: DC | PRN

## 2015-01-18 ENCOUNTER — Ambulatory Visit: Payer: Medicare Other

## 2015-01-28 ENCOUNTER — Encounter: Payer: Self-pay | Admitting: Gastroenterology

## 2015-01-28 ENCOUNTER — Ambulatory Visit (INDEPENDENT_AMBULATORY_CARE_PROVIDER_SITE_OTHER): Payer: Medicare Other | Admitting: Gastroenterology

## 2015-01-28 VITALS — BP 114/68 | HR 68 | Ht 66.54 in | Wt 157.2 lb

## 2015-01-28 DIAGNOSIS — K648 Other hemorrhoids: Secondary | ICD-10-CM

## 2015-01-28 DIAGNOSIS — K5900 Constipation, unspecified: Secondary | ICD-10-CM

## 2015-01-28 NOTE — Progress Notes (Signed)
    History of Present Illness: This is a 54 year old male returning for follow-up of constipation and intermittent small-volume rectal bleeding. He states MiraLAX is not effective. His bowel habits vary between a bowel movement every 2-4 days. He notes bloating and small amounts of rectal bleeding with hard stools. Colonoscopy performed in 02/2012 was unremarkable except for medium sized internal hemorrhoids.  Current Medications, Allergies, Past Medical History, Past Surgical History, Family History and Social History were reviewed in [employer] record.  Physical Exam: General: Well developed , well nourished, no acute distress Head: Normocephalic and atraumatic Eyes:  sclerae anicteric, EOMI Ears: Normal auditory acuity Mouth: No deformity or lesions Lungs: Clear throughout to auscultation Heart: Regular rate and rhythm; no murmurs, rubs or bruits Abdomen: Soft, non tender and non distended. No masses, hepatosplenomegaly or hernias noted. Normal Bowel sounds Musculoskeletal: Symmetrical with no gross deformities  Pulses:  Normal pulses noted Extremities: No clubbing, cyanosis, edema or deformities noted Neurological: Alert oriented x 4, grossly nonfocal Psychological:  Alert and cooperative. Normal mood and affect  Assessment and Recommendations:  1. Constipation. Internal hemorrhoids with bleeding. Increase dietary fiber and daily water intake. Trial of using prunes or prune juice daily. Milk of magnesia from once every other day to twice daily titrated for adequate bowel movements. Preparation H OTC daily as needed for hemorrhoidal symptoms. Follow-up with PCP.

## 2015-01-28 NOTE — Patient Instructions (Signed)
You have been given a high fiber diet.  Increase your water intake every day.   You can eat prunes or drink prune juice to help with constipation.   You can also take over the counter milk of magnesia every day.   Follow up with your primary care physician.   Thank you for choosing me and [facility].  Pricilla Riffle. Dagoberto Ligas., MD., [facility]   cc: Chari Manning, MD

## 2015-02-19 ENCOUNTER — Ambulatory Visit: Payer: Medicare Other | Attending: Internal Medicine

## 2015-02-19 VITALS — BP 115/72 | HR 87 | Temp 98.5°F | Resp 18 | Ht 66.0 in | Wt 164.0 lb

## 2015-02-19 DIAGNOSIS — E291 Testicular hypofunction: Secondary | ICD-10-CM

## 2015-02-19 DIAGNOSIS — R7989 Other specified abnormal findings of blood chemistry: Secondary | ICD-10-CM

## 2015-02-19 MED ORDER — TESTOSTERONE CYPIONATE 200 MG/ML IM SOLN
200.0000 mg | Freq: Once | INTRAMUSCULAR | Status: AC
  Administered 2015-02-19: 200 mg via INTRAMUSCULAR

## 2015-02-19 NOTE — Progress Notes (Signed)
Patient here to initiate 2nd set of testosterone injections.

## 2015-03-01 ENCOUNTER — Encounter: Payer: Self-pay | Admitting: Internal Medicine

## 2015-03-01 ENCOUNTER — Ambulatory Visit (HOSPITAL_BASED_OUTPATIENT_CLINIC_OR_DEPARTMENT_OTHER): Payer: Medicare Other | Admitting: Internal Medicine

## 2015-03-01 ENCOUNTER — Other Ambulatory Visit: Payer: Self-pay | Admitting: Internal Medicine

## 2015-03-01 ENCOUNTER — Ambulatory Visit (HOSPITAL_COMMUNITY)
Admission: RE | Admit: 2015-03-01 | Discharge: 2015-03-01 | Disposition: A | Discharge status: Home / Self Care | Payer: Medicare Other | Source: Ambulatory Visit | Attending: Internal Medicine | Admitting: Internal Medicine

## 2015-03-01 VITALS — BP 129/87 | HR 85 | Temp 98.0°F | Resp 16 | Ht 66.0 in | Wt 165.0 lb

## 2015-03-01 DIAGNOSIS — K219 Gastro-esophageal reflux disease without esophagitis: Secondary | ICD-10-CM

## 2015-03-01 DIAGNOSIS — M79671 Pain in right foot: Secondary | ICD-10-CM | POA: Diagnosis not present

## 2015-03-01 DIAGNOSIS — I10 Essential (primary) hypertension: Secondary | ICD-10-CM | POA: Diagnosis not present

## 2015-03-01 DIAGNOSIS — Z23 Encounter for immunization: Secondary | ICD-10-CM

## 2015-03-01 DIAGNOSIS — E785 Hyperlipidemia, unspecified: Secondary | ICD-10-CM | POA: Diagnosis not present

## 2015-03-01 DIAGNOSIS — M109 Gout, unspecified: Secondary | ICD-10-CM | POA: Diagnosis not present

## 2015-03-01 LAB — URIC ACID: Uric Acid, Serum: 9.9 mg/dL — ABNORMAL HIGH (ref 4.0–7.8)

## 2015-03-01 LAB — BASIC METABOLIC PANEL
BUN: 10 mg/dL (ref 7–25)
CALCIUM: 9.6 mg/dL (ref 8.6–10.3)
CO2: 31 mmol/L (ref 20–31)
CREATININE: 1.32 mg/dL (ref 0.70–1.33)
Chloride: 101 mmol/L (ref 98–110)
Glucose, Bld: 102 mg/dL — ABNORMAL HIGH (ref 65–99)
Potassium: 4.3 mmol/L (ref 3.5–5.3)
Sodium: 140 mmol/L (ref 135–146)

## 2015-03-01 MED ORDER — TRAMADOL HCL 50 MG PO TABS: 50.0000 mg | ORAL_TABLET | Freq: Three times a day (TID) | ORAL | Status: DC | PRN

## 2015-03-01 MED ORDER — PREDNISONE 10 MG PO TABS: 10.0000 mg | ORAL_TABLET | Freq: Every day | ORAL | Status: DC

## 2015-03-01 MED ORDER — INDOMETHACIN 25 MG PO CAPS: 25.0000 mg | ORAL_CAPSULE | Freq: Two times a day (BID) | ORAL | Status: DC

## 2015-03-01 MED ORDER — PANTOPRAZOLE SODIUM 40 MG PO TBEC: 40.0000 mg | DELAYED_RELEASE_TABLET | Freq: Every day | ORAL | Status: DC

## 2015-03-01 NOTE — Progress Notes (Signed)
Patient ID: [name] [name], male   DOB: 07-05-1960, 54 y.o.   MRN: [id number]   ELF:[id number]  HEN:[id number]  DOB - 1960/07/03  CC:  Chief Complaint  Patient presents with  . Follow-up       HPI: [name] [name] is a 54 y.o. male here today for follow up for pain in right foot radiating to right thigh. Patient states pain is on going and rates pain as 8/10. Patient treated for gout with uric acid level of 8.4 on 12/03/14. Patient was given colchicine 0.6 mg BID with no relief. Patient states pain is continuous since 12/03/14 and throbbing on both the top and instep of the right foot. Swelling is worse at night and is not resolved with elevation, heat, or cold. Patient states he never filled the second script for colchicine because he did not feel the medication worked well for him. Pain affects his mobility and daily activities. Patient also receiving testosterone injections for persistent low levels even after a 3 month treatment course.   No Known Allergies Past Medical History  Diagnosis Date  . Hypertension   . Hyperlipidemia   . GERD (gastroesophageal reflux disease)   . Seasonal allergies   . Depression   . Anxiety   . Panic   . Insomnia   . Erectile dysfunction    Current Outpatient Prescriptions on File Prior to Visit  Medication Sig Dispense Refill  . atorvastatin (LIPITOR) 20 MG tablet Take 1 tablet (20 mg total) by mouth daily. 90 tablet 3  . colchicine 0.6 MG tablet Take 1 tablet (0.6 mg total) by mouth 2 (two) times daily. 14 tablet 0  . lisinopril (PRINIVIL,ZESTRIL) 20 MG tablet Take 1 tablet (20 mg total) by mouth daily. 90 tablet 3  . mometasone (NASONEX) 50 MCG/ACT nasal spray Place 2 sprays into the nose daily as needed (congestion). 17 g 2  . sucralfate (CARAFATE) 1 G tablet Take 1 tablet (1 g total) by mouth 4 (four) times daily -  with meals and at bedtime. 90 tablet 3  . ergocalciferol (DRISDOL) 50000 UNITS capsule Take 1 capsule (50,000 Units total) by mouth  once a week. 10 capsule 0   Current Facility-Administered Medications on File Prior to Visit  Medication Dose Route Frequency Provider Last Rate Last Dose  . testosterone cypionate (DEPOTESTOTERONE CYPIONATE) injection 200 mg  200 mg Intramuscular Q28 days Lance Bosch, NP   200 mg at 11/17/14 1512   Family History  Problem Relation Age of Onset  . Hypertension Mother   . Stroke Maternal Aunt   . Prostate cancer Father    Social History   Social History  . Marital Status: Single    Spouse Name: N/A  . Number of Children: 2  . Years of Education: N/A   Occupational History  . Disabled    Social History Main Topics  . Smoking status: Never Smoker   . Smokeless tobacco: Never Used  . Alcohol Use: No  . Drug Use: No  . Sexual Activity: Not on file   Other Topics Concern  . Not on file   Social History Narrative    Review of Systems: Constitutional: Negative for fever, chills, diaphoresis, activity change, appetite change and fatigue. HENT: Negative for ear pain, nosebleeds, congestion, facial swelling, rhinorrhea, neck pain, neck stiffness and ear discharge.  Eyes: Negative for pain, discharge, redness, itching and visual disturbance. Respiratory: Negative for cough, choking, chest tightness, shortness of breath, wheezing and stridor.  Cardiovascular: Negative for chest pain,  palpitations and leg swelling. Gastrointestinal: Negative for abdominal distention. Genitourinary: Negative for dysuria, urgency, frequency, hematuria, flank pain, decreased urine volume, difficulty urinating and dyspareunia.  Musculoskeletal: Negative for back pain, Patient positive for right foot swelling. Neurological: Negative for dizziness, tremors, seizures, syncope, facial asymmetry, speech difficulty, weakness, light-headedness, numbness and headaches.  Hematological: Negative for adenopathy. Does not bruise/bleed easily. Psychiatric/Behavioral: Negative for hallucinations, behavioral  problems, confusion, dysphoric mood, decreased concentration and agitation.    Objective:   Filed Vitals:   03/01/15 1453  BP: 129/87  Pulse: 85  Temp: 98 F (36.7 C)  Resp: 16    Physical Exam: Constitutional: Patient appears well-developed and well-nourished. No distress. HENT: Normocephalic, atraumatic, External right and left ear normal. Oropharynx is clear and moist.  Eyes: Conjunctivae and EOM are normal. PERRLA, no scleral icterus. Neck: Normal ROM. Neck supple. No JVD. No tracheal deviation. No thyromegaly. CVS: RRR, S1/S2 +, no murmurs, no gallops, no carotid bruit.  Pulmonary: Effort and breath sounds normal, no stridor, rhonchi, wheezes, rales.  Abdominal: Soft. BS +, no distension, tenderness, rebound or guarding.  Musculoskeletal: Normal range of motion. Patient positive for 2+ edema on right foot, with tenderness and pain he rates as 9 of 10. No redness or increase in temperature noted on right foot.  Lymphadenopathy: No lymphadenopathy noted, cervical, inguinal or axillary Neuro: Alert. Normal reflexes, muscle tone coordination. No cranial nerve deficit. Skin: Skin is warm and dry. No rash noted. Not diaphoretic. No erythema. No pallor. Psychiatric: Normal mood and affect. Behavior, judgment, thought content normal.  Lab Results  Component Value Date   WBC 5.6 12/03/2014   HGB 14.8 12/03/2014   HCT 44.5 12/03/2014   MCV 93.1 12/03/2014   PLT 216 12/03/2014   Lab Results  Component Value Date   CREATININE 0.92 08/04/2014   BUN 9 08/04/2014   NA 141 08/04/2014   K 3.9 08/04/2014   CL 108 08/04/2014   CO2 25 08/04/2014    No results found for: HGBA1C Lipid Panel     Component Value Date/Time   CHOL 248* 09/04/2014 0918   TRIG 205* 09/04/2014 0918   HDL 43 09/04/2014 0918   CHOLHDL 5.8 09/04/2014 0918   VLDL 41* 09/04/2014 0918   LDLCALC 164* 09/04/2014 0918       Assessment and plan:   [name] [name] was seen today for follow-up.  Diagnoses and all  orders for this visit:  Essential hypertension -     Basic metabolic panel -     Lisinopril 20 mg once per day. Stop hydrochlorothiazide  Patient will discontinue HCTZ due to gout. HCTZ may be causing more problems. Will continue on Lisinopril 20 mg, he is stable currently. Will recheck BP in 2 weeks.   Gout of right foot, unspecified cause, unspecified chronicity -     Uric Acid -     indomethacin (INDOCIN) 25 MG capsule; Take 1 capsule (25 mg total) by mouth 2 (two) times daily with a meal. -     predniSONE (DELTASONE) 10 MG tablet; Take 1 tablet (10 mg total) by mouth daily with breakfast. Take 40 mg (4 pills) for 5 days, 30 mg (3 pills) for 4 days, 20 mg (2 pills) for 3 days, and 10 mg (1 pill) for 2 days. I will place patient on high dose steroids with NSAID's. Once he completes therapy I will bring him back to place him on Allopurinol. I will continue to monitor his uric acid levels once preventative therapy is initiated.  Diet education and treatment of gout was given to patient and his wife. The patient and his wife verbalized understanding.   HLD (hyperlipidemia)       -    Refilled Atorvastatin 20 mg by mouth once per day Last LDL was 09/2014 which was not at goal. Will recheck in October.  Gastroesophageal reflux disease, esophagitis presence not specified -     pantoprazole (PROTONIX) 40 MG tablet; Take 1 tablet (40 mg total) by mouth daily. Stable, meds refilled   Need for influenza vaccination -     Flu vaccine > 3yo with preservative IM (Fluvirin Influenza Split)  Right foot pain -     DG Foot Complete Right; Future will make sure there is nothing missed such as fracture -     traMADol (ULTRAM) 50 MG tablet; Take 1 tablet (50 mg total) by mouth every 8 (eight) hours as needed.   Return in about 2 weeks (around 03/15/2015) for 2 weeks PCP-gout/BP and 3 mo PCP HTN.   Clois Dupes, RN, BSN, [place] [facility] [phone] 03/01/2015, 3:40 PM

## 2015-03-01 NOTE — Progress Notes (Signed)
Patient here for follow up on his gout Patient states he right foot has been swollen for a while And the pain shoots up to his thigh

## 2015-03-01 NOTE — Progress Notes (Signed)
Patient ID: [name] [name], male   DOB: 22-Nov-1960, 54 y.o.   MRN: [id number] I have seen patient with student. I agree with student's assessment and plan  Lance Bosch, NP 03/01/2015 9:54 PM

## 2015-03-02 ENCOUNTER — Telehealth: Payer: Self-pay

## 2015-03-02 NOTE — Telephone Encounter (Signed)
Which one colchicine or indomethicin? Let me know and I will change the order

## 2015-03-02 NOTE — Telephone Encounter (Signed)
-----   Message from Lance Bosch, NP sent at 03/02/2015 11:14 AM EDT ----- Gout levels are higher. Finish all steroids and make sure he comes back so that we can place him on next medication to hep lower the levels

## 2015-03-02 NOTE — Telephone Encounter (Signed)
Spoke with patient this am  He is aware of his lab results Patient stated the prescription you gave him For his gout-his insurance company will not pay for

## 2015-03-04 NOTE — Telephone Encounter (Signed)
-----   Message from Lance Bosch, NP sent at 03/02/2015 11:14 AM EDT ----- Normal foot xray---i sent previous note on him as well

## 2015-03-04 NOTE — Telephone Encounter (Signed)
Patient verified DOB Patient made aware of normal foot xray.  Patient states his gout medication is available at [facility].  Patient states he will pick the medication up this week. Patient had no further questions.

## 2015-03-19 ENCOUNTER — Ambulatory Visit: Payer: Medicare Other | Attending: Internal Medicine | Admitting: Internal Medicine

## 2015-03-19 ENCOUNTER — Encounter: Payer: Self-pay | Admitting: Internal Medicine

## 2015-03-19 VITALS — BP 136/84 | HR 70 | Temp 98.5°F | Resp 16 | Ht 66.0 in | Wt 169.0 lb

## 2015-03-19 DIAGNOSIS — M109 Gout, unspecified: Secondary | ICD-10-CM

## 2015-03-19 DIAGNOSIS — M10071 Idiopathic gout, right ankle and foot: Secondary | ICD-10-CM | POA: Diagnosis not present

## 2015-03-19 DIAGNOSIS — E291 Testicular hypofunction: Secondary | ICD-10-CM | POA: Insufficient documentation

## 2015-03-19 DIAGNOSIS — R7989 Other specified abnormal findings of blood chemistry: Secondary | ICD-10-CM

## 2015-03-19 MED ORDER — ALLOPURINOL 100 MG PO TABS: 100.0000 mg | ORAL_TABLET | Freq: Every day | ORAL | Status: DC

## 2015-03-19 MED ORDER — TESTOSTERONE CYPIONATE 200 MG/ML IM SOLN
200.0000 mg | Freq: Once | INTRAMUSCULAR | Status: AC
  Administered 2015-03-19: 200 mg via INTRAMUSCULAR

## 2015-03-19 NOTE — Progress Notes (Signed)
Patient here for follow up on his gout and to receive his testosterone injection

## 2015-03-19 NOTE — Progress Notes (Signed)
Patient ID: [name] [name], male   DOB: [birth year], 54 y.o.   MRN: [id number]  CC: gout, testosterone  HPI: [name] [name] is a 54 y.o. male here today for a follow up visit.  Patient has past medical history of HTN, gout, low testosterone. Patient is here for his second testosterone injection. He reports that he does not have problems with erections but he does continue to having feelings of fatigue. He has no complaints with testosterone injections. Patient reports that since taking Indomethacin he has noticed a rash on his upper body. He denies any new foods, soaps, detergents, lotions. The rash is slightly itchy and red. He reports that he took all the prednisone but did not know he was suppose to pick up the prescription for colchicine so he has not taken it.   Patient has No headache, No chest pain, No abdominal pain - No Nausea, No new weakness tingling or numbness, No Cough - SOB.  No Known Allergies Past Medical History  Diagnosis Date  . Hypertension   . Hyperlipidemia   . GERD (gastroesophageal reflux disease)   . Seasonal allergies   . Depression   . Anxiety   . Panic   . Insomnia   . Erectile dysfunction    Current Outpatient Prescriptions on File Prior to Visit  Medication Sig Dispense Refill  . atorvastatin (LIPITOR) 20 MG tablet Take 1 tablet (20 mg total) by mouth daily. 90 tablet 3  . colchicine 0.6 MG tablet Take 1 tablet (0.6 mg total) by mouth 2 (two) times daily. 14 tablet 0  . indomethacin (INDOCIN) 25 MG capsule Take 1 capsule (25 mg total) by mouth 2 (two) times daily with a meal. 30 capsule 1  . lisinopril (PRINIVIL,ZESTRIL) 20 MG tablet Take 1 tablet (20 mg total) by mouth daily. 90 tablet 3  . pantoprazole (PROTONIX) 40 MG tablet Take 1 tablet (40 mg total) by mouth daily. 30 tablet 3  . ergocalciferol (DRISDOL) 50000 UNITS capsule Take 1 capsule (50,000 Units total) by mouth once a week. 10 capsule 0  . mometasone (NASONEX) 50 MCG/ACT nasal spray Place 2  sprays into the nose daily as needed (congestion). 17 g 2  . predniSONE (DELTASONE) 10 MG tablet Take 1 tablet (10 mg total) by mouth daily with breakfast. Take 40 mg (4 pills) for 5 days, 30 mg (3 pills) for 4 days, 20 mg (2 pills) for 3 days, and 10 mg (1 pill) for 2 days. 40 tablet 0  . sucralfate (CARAFATE) 1 G tablet Take 1 tablet (1 g total) by mouth 4 (four) times daily -  with meals and at bedtime. 90 tablet 3  . traMADol (ULTRAM) 50 MG tablet Take 1 tablet (50 mg total) by mouth every 8 (eight) hours as needed. 60 tablet 1   Current Facility-Administered Medications on File Prior to Visit  Medication Dose Route Frequency Provider Last Rate Last Dose  . testosterone cypionate (DEPOTESTOTERONE CYPIONATE) injection 200 mg  200 mg Intramuscular Q28 days Lance Bosch, NP   200 mg at 11/17/14 1512   Family History  Problem Relation Age of Onset  . Hypertension Mother   . Stroke Maternal Aunt   . Prostate cancer Father    Social History   Social History  . Marital Status: Single    Spouse Name: N/A  . Number of Children: 2  . Years of Education: N/A   Occupational History  . Disabled    Social History Main Topics  . Smoking  status: Never Smoker   . Smokeless tobacco: Never Used  . Alcohol Use: No  . Drug Use: No  . Sexual Activity: Not on file   Other Topics Concern  . Not on file   Social History Narrative    Review of Systems: Other than what is stated in HPI, all other systems are negative.   Objective:   Filed Vitals:   03/19/15 0912  BP: 149/88  Pulse: 77  Temp: 98.5 F (36.9 C)  Resp: 16    Physical Exam  Constitutional: He is oriented to person, place, and time.  Cardiovascular: Normal rate, regular rhythm and normal heart sounds.   Pulmonary/Chest: Effort normal and breath sounds normal. He has no wheezes.  Musculoskeletal: He exhibits no edema or tenderness.  Neurological: He is alert and oriented to person, place, and time.  Skin: Rash (chest  and back, erythematous papules ) noted.     Lab Results  Component Value Date   WBC 5.6 12/03/2014   HGB 14.8 12/03/2014   HCT 44.5 12/03/2014   MCV 93.1 12/03/2014   PLT 216 12/03/2014   Lab Results  Component Value Date   CREATININE 1.32 03/01/2015   BUN 10 03/01/2015   NA 140 03/01/2015   K 4.3 03/01/2015   CL 101 03/01/2015   CO2 31 03/01/2015    No results found for: HGBA1C Lipid Panel     Component Value Date/Time   CHOL 248* 09/04/2014 0918   TRIG 205* 09/04/2014 0918   HDL 43 09/04/2014 0918   CHOLHDL 5.8 09/04/2014 0918   VLDL 41* 09/04/2014 0918   LDLCALC 164* 09/04/2014 0918       Assessment and plan:   [name] was seen today for follow-up.  Diagnoses and all orders for this visit:  Low testosterone -     testosterone cypionate (DEPOTESTOSTERONE CYPIONATE) injection 200 mg; Inject 1 mL (200 mg total) into the muscle once. -     Testosterone; Future Will recheck levels in December. If levels continue to remain low I will send him to urology for further management  Acute gout of right foot, unspecified cause -     Begin allopurinol (ZYLOPRIM) 100 MG tablet; Take 1 tablet (100 mg total) by mouth daily. -     Uric Acid; Future I will place patient on Allopurinol for now. Will not worry about completing colchicine for now. Will recheck uric acid level in December, if not lowered I can increase dose to Allopurinol 300 mg daily.   Return for December 15th--lab visit and 30 days RN-testosterone injection.       Lance Bosch, [facility] [phone] 03/19/2015, 9:20 AM

## 2015-04-19 ENCOUNTER — Ambulatory Visit: Payer: Medicare Other | Attending: Internal Medicine

## 2015-04-19 VITALS — BP 128/84 | HR 90 | Temp 97.9°F | Resp 18 | Ht 66.0 in | Wt 172.0 lb

## 2015-04-19 DIAGNOSIS — E291 Testicular hypofunction: Secondary | ICD-10-CM | POA: Diagnosis present

## 2015-04-19 DIAGNOSIS — K219 Gastro-esophageal reflux disease without esophagitis: Secondary | ICD-10-CM | POA: Diagnosis not present

## 2015-04-19 DIAGNOSIS — R7989 Other specified abnormal findings of blood chemistry: Secondary | ICD-10-CM

## 2015-04-19 MED ORDER — TESTOSTERONE CYPIONATE 200 MG/ML IM SOLN
200.0000 mg | Freq: Once | INTRAMUSCULAR | Status: AC
  Administered 2015-04-19: 200 mg via INTRAMUSCULAR

## 2015-04-19 MED ORDER — PANTOPRAZOLE SODIUM 40 MG PO TBEC: 40.0000 mg | DELAYED_RELEASE_TABLET | Freq: Every day | ORAL | Status: DC

## 2015-04-19 NOTE — Patient Instructions (Signed)
Please come back in 1 month for a lab visit and have your testosterone level redrawn.

## 2015-04-19 NOTE — Progress Notes (Signed)
Patient here for 3rd testosterone injection. Last injection given 03/19/15.

## 2015-05-05 ENCOUNTER — Other Ambulatory Visit: Payer: Self-pay

## 2015-05-05 ENCOUNTER — Other Ambulatory Visit: Payer: Self-pay | Admitting: Internal Medicine

## 2015-05-05 DIAGNOSIS — M109 Gout, unspecified: Secondary | ICD-10-CM

## 2015-05-05 MED ORDER — INDOMETHACIN 25 MG PO CAPS: 25.0000 mg | ORAL_CAPSULE | Freq: Two times a day (BID) | ORAL | Status: DC

## 2015-05-19 ENCOUNTER — Ambulatory Visit: Payer: Medicare Other

## 2015-07-02 ENCOUNTER — Ambulatory Visit: Payer: Medicare Other | Attending: Internal Medicine

## 2015-07-02 DIAGNOSIS — M109 Gout, unspecified: Secondary | ICD-10-CM

## 2015-07-02 DIAGNOSIS — R7989 Other specified abnormal findings of blood chemistry: Secondary | ICD-10-CM

## 2015-07-02 LAB — URIC ACID: URIC ACID, SERUM: 7.3 mg/dL (ref 4.0–7.8)

## 2015-07-03 LAB — TESTOSTERONE: Testosterone: 193 ng/dL — ABNORMAL LOW (ref 250–827)

## 2015-07-05 ENCOUNTER — Telehealth: Payer: Self-pay

## 2015-07-05 DIAGNOSIS — R7989 Other specified abnormal findings of blood chemistry: Secondary | ICD-10-CM

## 2015-07-05 NOTE — Telephone Encounter (Signed)
-----   Message from Lance Bosch, NP sent at 07/05/2015  9:14 AM EST ----- Testosterone is still low. Please place referral to Urology for further management

## 2015-07-05 NOTE — Telephone Encounter (Signed)
Spoke with patient this am and he is aware of his low testosterone results Referral to urology has been placed in epic

## 2015-08-09 ENCOUNTER — Other Ambulatory Visit: Payer: Self-pay | Admitting: Internal Medicine

## 2015-08-19 ENCOUNTER — Telehealth: Payer: Self-pay

## 2015-08-19 ENCOUNTER — Other Ambulatory Visit: Payer: Self-pay | Admitting: Internal Medicine

## 2015-08-19 DIAGNOSIS — I10 Essential (primary) hypertension: Secondary | ICD-10-CM

## 2015-08-19 MED ORDER — LISINOPRIL 20 MG PO TABS: 20.0000 mg | ORAL_TABLET | Freq: Every day | ORAL | Status: DC

## 2015-08-19 NOTE — Telephone Encounter (Signed)
Spoke with patient and he is aware i have refilled His blood pressure medication

## 2015-08-19 NOTE — Telephone Encounter (Signed)
Patient states that an electronic request was sent to PCP last week, he has yet to get medications refilled...patient is completely out of his medication, please follow up with patient. Patient might go to ED because he needs his medications as soon as possible and might not be able to wait 24 hrs for refilled....please follow up

## 2015-09-07 ENCOUNTER — Other Ambulatory Visit: Payer: Self-pay | Admitting: Internal Medicine

## 2015-10-06 ENCOUNTER — Other Ambulatory Visit: Payer: Self-pay | Admitting: Internal Medicine

## 2015-11-25 ENCOUNTER — Other Ambulatory Visit: Payer: Self-pay | Admitting: Internal Medicine

## 2015-11-26 ENCOUNTER — Other Ambulatory Visit: Payer: Self-pay | Admitting: Internal Medicine

## 2015-12-23 ENCOUNTER — Other Ambulatory Visit: Payer: Self-pay | Admitting: Internal Medicine

## 2015-12-23 NOTE — Telephone Encounter (Signed)
Refill lisinopril 

## 2017-01-02 IMAGING — CT CT ABD-PELV W/ CM
1 of 3 series · 14 of 32 positions shown, 19 images · IV contrast (OMNIPAQUE 300)
Comparison: Chest radiographs 07/25/2014 and earlier.

CLINICAL DATA: 53-year-old male with epigastric, chest and
abdominal pain plus vomiting. Initial encounter.

EXAM:
CT ABDOMEN AND PELVIS WITH CONTRAST
TECHNIQUE: Multidetector CT imaging of the abdomen and pelvis was performed
using the standard protocol following bolus administration of
intravenous contrast.
CONTRAST:  100mL OMNIPAQUE IOHEXOL 300 MG/ML SOLN, 50mL OMNIPAQUE
IOHEXOL 300 MG/ML SOLN

[Series 2: abd/pel with · axial · 0.74mm/px · z∈[-673,-298]mm · 14 of 85 slices shown, 19 images]
[im 5/85  soft-tissue]
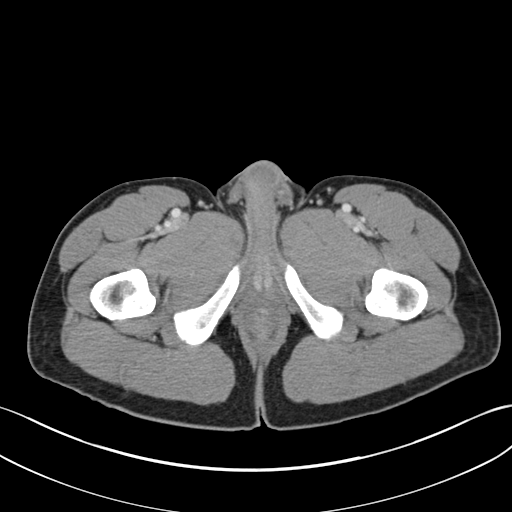
[im 5/85  bone]
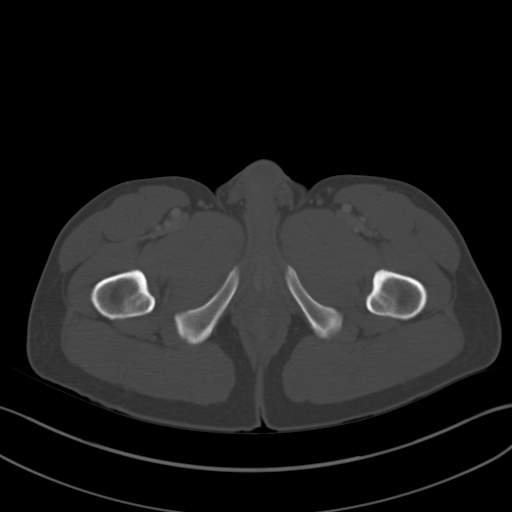
[im 14/85  soft-tissue]
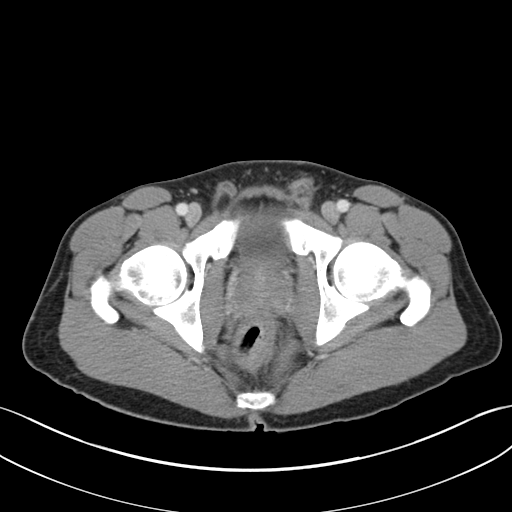
[im 18/85  soft-tissue]
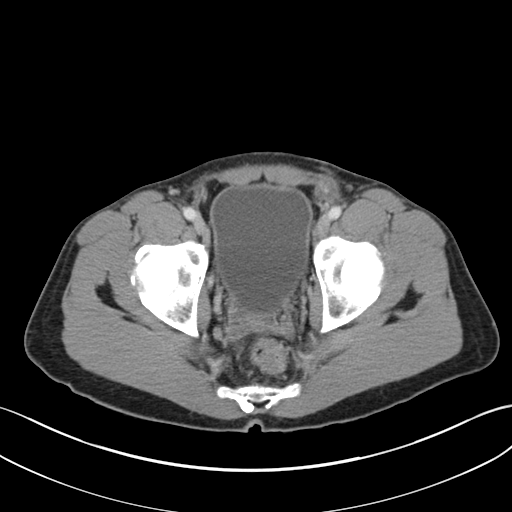
[im 23/85  soft-tissue]
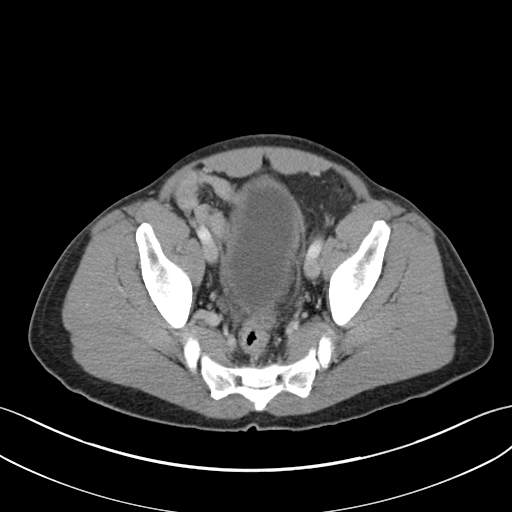
[im 31/85  soft-tissue]
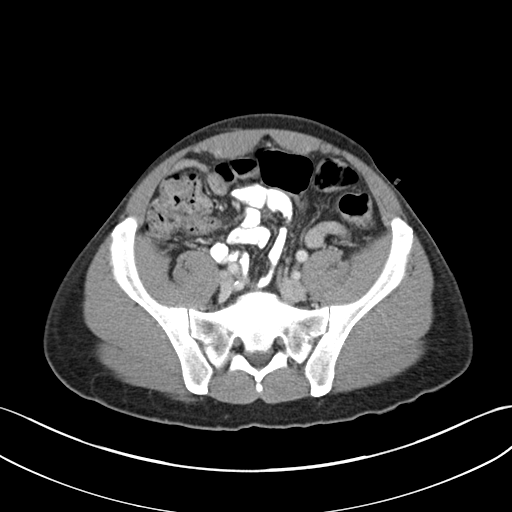
[im 36/85  soft-tissue]
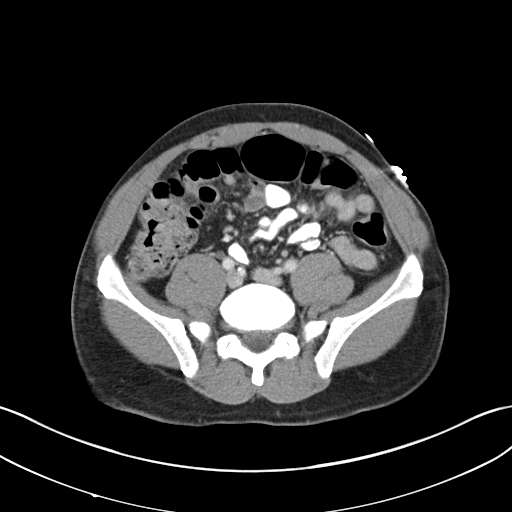
[im 45/85  soft-tissue]
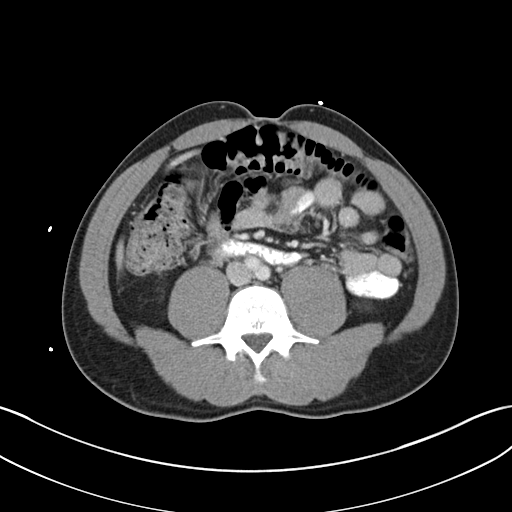
[im 49/85  soft-tissue]
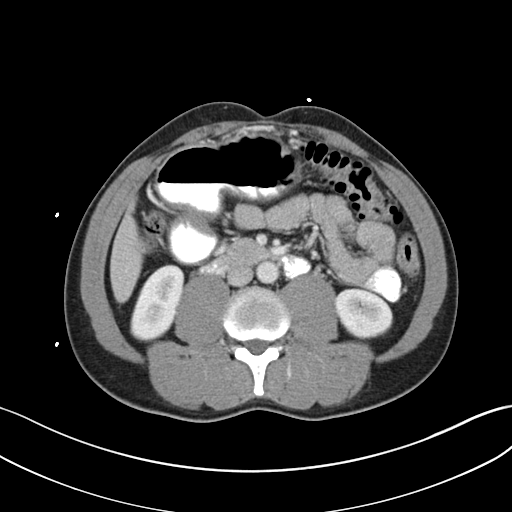
[im 54/85  soft-tissue]
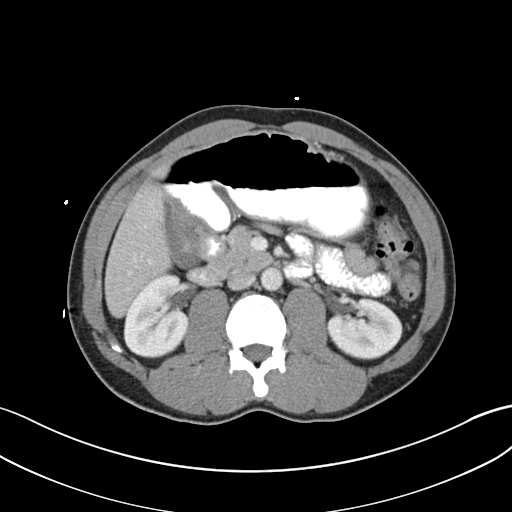
[im 54/85  bone]
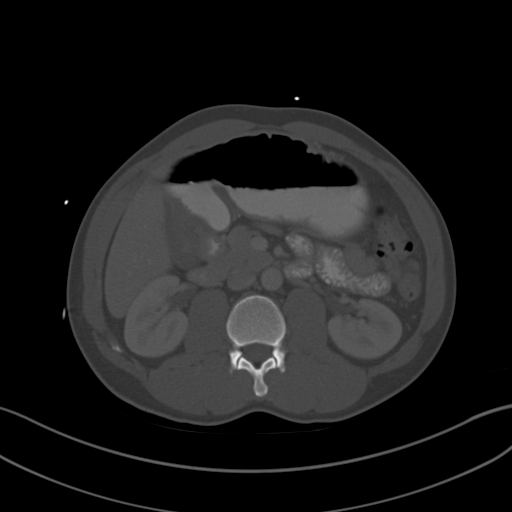
[im 62/85  soft-tissue]
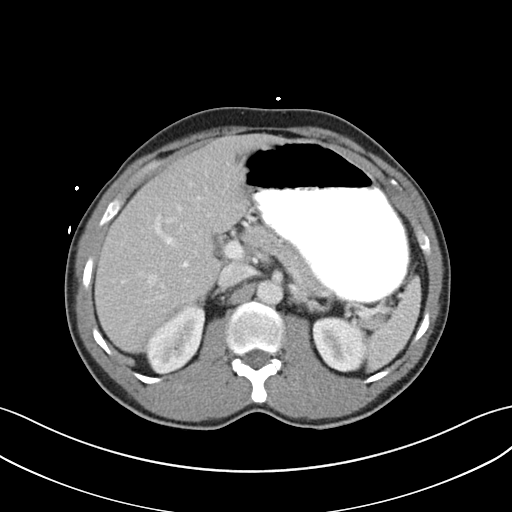
[im 67/85  soft-tissue]
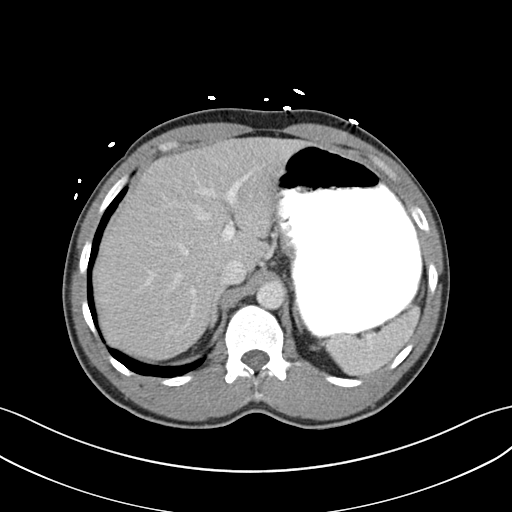
[im 67/85  lung]
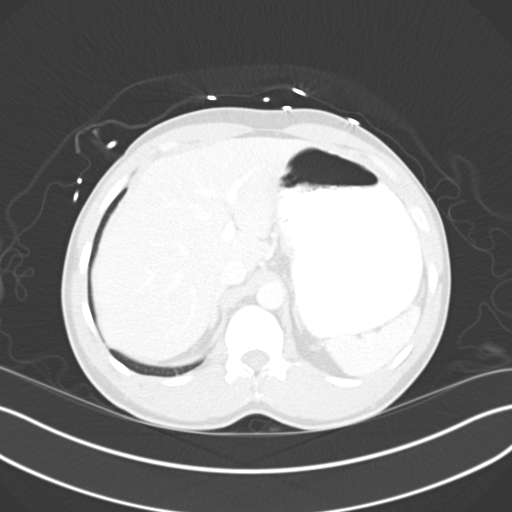
[im 71/85  soft-tissue]
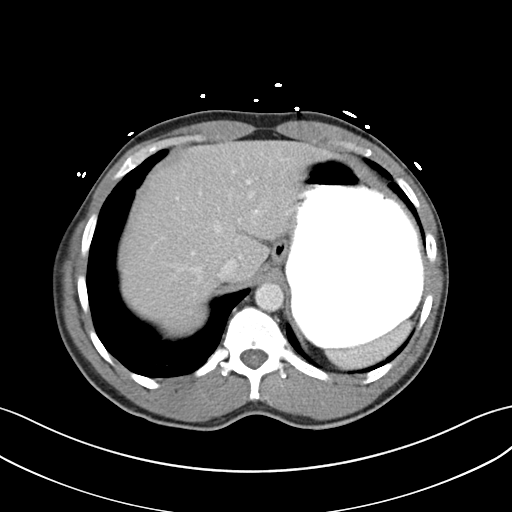
[im 71/85  lung]
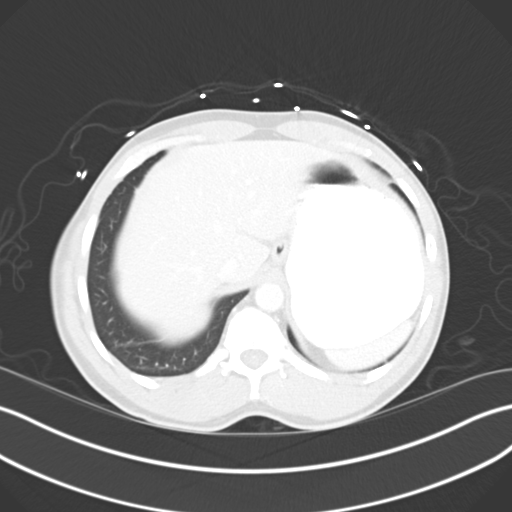
[im 76/85  lung]
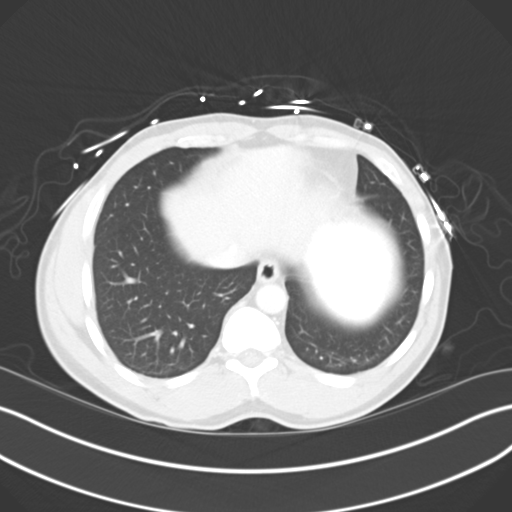
[im 80/85  soft-tissue]
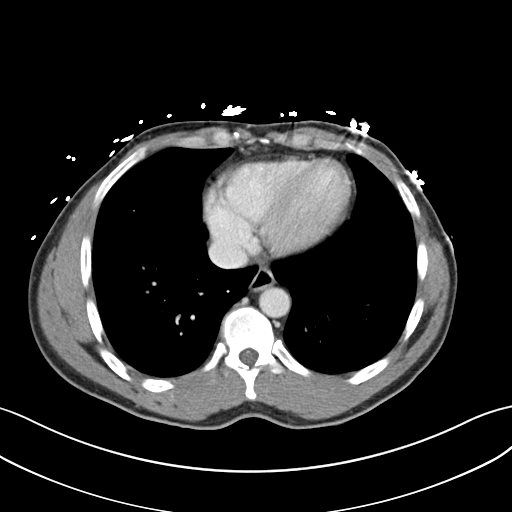
[im 80/85  lung]
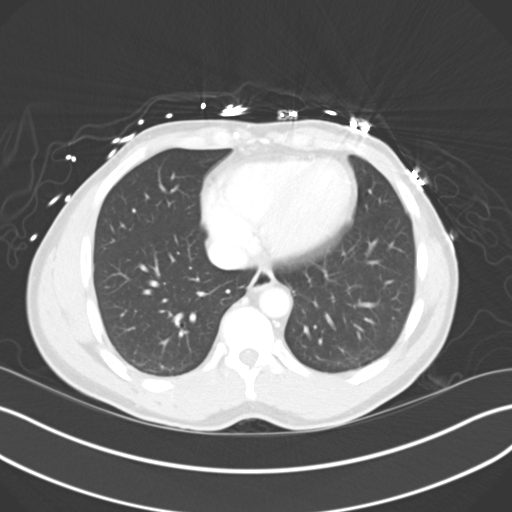

[14 of 32 positions shown; findings below may reference images not displayed]

FINDINGS: Negative lung bases except for mild atelectasis. No pericardial or
pleural effusion.

No acute osseous abnormality identified.

No pelvic free fluid.  Unremarkable bladder.  Negative rectum.

Redundant sigmoid colon, otherwise negative. Left colon within
normal limits. The splenic flexure appears mildly thick walled and
indistinct (series 2, image 31). The mid transverse colon has a more
normal appearance. Retained stool in the proximal transverse and
right colon. Normal appendix.

No dilated small bowel. Contrast and air distending the stomach and
proximal duodenum. The gastric antrum is thick walled. No
surrounding stranding. Oral contrast has not yet reached the distal
small bowel.

Liver, gallbladder, spleen, pancreas, adrenal glands, portal venous
system, and major arterial structures are within normal limits.
Renal enhancement and contrast excretion within normal limits. No
abdominal free fluid. No lymphadenopathy
IMPRESSION: 1. Two areas of possible acute intestinal inflammation: Gastric
antrum (such as due to peptic ulcer disease), and splenic flexure
(such as with infectious colitis). No associated abdominal fluid or
fat stranding in these areas.
2. Otherwise negative CT abdomen and pelvis.

## 2018-01-28 ENCOUNTER — Other Ambulatory Visit: Payer: Self-pay | Admitting: General Surgery

## 2018-01-28 ENCOUNTER — Ambulatory Visit
Admission: RE | Admit: 2018-01-28 | Discharge: 2018-01-28 | Disposition: A | Discharge status: Home / Self Care | Payer: Medicare Other | Source: Ambulatory Visit | Attending: General Surgery | Admitting: General Surgery

## 2018-01-28 DIAGNOSIS — R1032 Left lower quadrant pain: Secondary | ICD-10-CM

## 2018-01-28 MED ORDER — IOPAMIDOL (ISOVUE-300) INJECTION 61%
100.0000 mL | Freq: Once | INTRAVENOUS | Status: AC | PRN
  Administered 2018-01-28: 100 mL via INTRAVENOUS

## 2018-03-20 ENCOUNTER — Other Ambulatory Visit (HOSPITAL_BASED_OUTPATIENT_CLINIC_OR_DEPARTMENT_OTHER): Payer: Self-pay

## 2018-03-20 DIAGNOSIS — R0683 Snoring: Secondary | ICD-10-CM

## 2018-04-15 ENCOUNTER — Other Ambulatory Visit: Payer: Self-pay

## 2018-04-15 ENCOUNTER — Encounter (HOSPITAL_BASED_OUTPATIENT_CLINIC_OR_DEPARTMENT_OTHER): Payer: Self-pay | Admitting: Emergency Medicine

## 2018-04-15 ENCOUNTER — Emergency Department (HOSPITAL_BASED_OUTPATIENT_CLINIC_OR_DEPARTMENT_OTHER): Payer: Medicare Other

## 2018-04-15 ENCOUNTER — Emergency Department (HOSPITAL_BASED_OUTPATIENT_CLINIC_OR_DEPARTMENT_OTHER)
Admission: EM | Admit: 2018-04-15 | Discharge: 2018-04-15 | Disposition: A | Discharge status: Home / Self Care | Payer: Medicare Other | Attending: Emergency Medicine | Admitting: Emergency Medicine

## 2018-04-15 DIAGNOSIS — I10 Essential (primary) hypertension: Secondary | ICD-10-CM | POA: Insufficient documentation

## 2018-04-15 DIAGNOSIS — R110 Nausea: Secondary | ICD-10-CM

## 2018-04-15 DIAGNOSIS — J069 Acute upper respiratory infection, unspecified: Secondary | ICD-10-CM | POA: Diagnosis not present

## 2018-04-15 DIAGNOSIS — R531 Weakness: Secondary | ICD-10-CM | POA: Diagnosis present

## 2018-04-15 DIAGNOSIS — K2100 Gastro-esophageal reflux disease with esophagitis, without bleeding: Secondary | ICD-10-CM

## 2018-04-15 DIAGNOSIS — F329 Major depressive disorder, single episode, unspecified: Secondary | ICD-10-CM | POA: Diagnosis not present

## 2018-04-15 DIAGNOSIS — F419 Anxiety disorder, unspecified: Secondary | ICD-10-CM | POA: Insufficient documentation

## 2018-04-15 LAB — CBC WITH DIFFERENTIAL/PLATELET
ABS IMMATURE GRANULOCYTES: 0.03 10*3/uL (ref 0.00–0.07)
BASOS ABS: 0 10*3/uL (ref 0.0–0.1)
BASOS PCT: 0 %
EOS ABS: 0 10*3/uL (ref 0.0–0.5)
Eosinophils Relative: 0 %
HCT: 39.6 % (ref 39.0–52.0)
Hemoglobin: 13.3 g/dL (ref 13.0–17.0)
IMMATURE GRANULOCYTES: 1 %
Lymphocytes Relative: 31 %
Lymphs Abs: 1.6 10*3/uL (ref 0.7–4.0)
MCH: 31.4 pg (ref 26.0–34.0)
MCHC: 33.6 g/dL (ref 30.0–36.0)
MCV: 93.4 fL (ref 80.0–100.0)
Monocytes Absolute: 0.3 10*3/uL (ref 0.1–1.0)
Monocytes Relative: 5 %
NEUTROS ABS: 3.2 10*3/uL (ref 1.7–7.7)
NEUTROS PCT: 63 %
NRBC: 0 % (ref 0.0–0.2)
Platelets: 236 10*3/uL (ref 150–400)
RBC: 4.24 MIL/uL (ref 4.22–5.81)
RDW: 12.5 % (ref 11.5–15.5)
WBC: 5.1 10*3/uL (ref 4.0–10.5)

## 2018-04-15 LAB — COMPREHENSIVE METABOLIC PANEL
ALT: 40 U/L (ref 0–44)
AST: 40 U/L (ref 15–41)
Albumin: 4.3 g/dL (ref 3.5–5.0)
Alkaline Phosphatase: 68 U/L (ref 38–126)
Anion gap: 10 (ref 5–15)
BUN: 12 mg/dL (ref 6–20)
CHLORIDE: 106 mmol/L (ref 98–111)
CO2: 22 mmol/L (ref 22–32)
Calcium: 8.6 mg/dL — ABNORMAL LOW (ref 8.9–10.3)
Creatinine, Ser: 1.1 mg/dL (ref 0.61–1.24)
GFR calc Af Amer: >60 mL/min (ref >60)
Glucose, Bld: 112 mg/dL — ABNORMAL HIGH (ref 70–99)
POTASSIUM: 3.5 mmol/L (ref 3.5–5.1)
SODIUM: 138 mmol/L (ref 135–145)
Total Bilirubin: 0.8 mg/dL (ref 0.3–1.2)
Total Protein: 7.5 g/dL (ref 6.5–8.1)

## 2018-04-15 LAB — LIPASE, BLOOD: Lipase: 27 U/L (ref 11–51)

## 2018-04-15 LAB — TROPONIN I

## 2018-04-15 MED ORDER — FLUTICASONE PROPIONATE 50 MCG/ACT NA SUSP: 2.0000 | Freq: Every day | NASAL | 0 refills | Status: DC

## 2018-04-15 MED ORDER — LIDOCAINE VISCOUS HCL 2 % MT SOLN
15.0000 mL | Freq: Once | OROMUCOSAL | Status: AC
  Administered 2018-04-15: 15 mL via OROMUCOSAL
  Filled 2018-04-15: qty 15

## 2018-04-15 MED ORDER — DIPHENHYDRAMINE HCL 25 MG PO TABS: 25.0000 mg | ORAL_TABLET | Freq: Three times a day (TID) | ORAL | 0 refills | Status: DC | PRN

## 2018-04-15 MED ORDER — FAMOTIDINE 20 MG PO TABS: 20.0000 mg | ORAL_TABLET | Freq: Two times a day (BID) | ORAL | 0 refills | Status: DC

## 2018-04-15 MED ORDER — FAMOTIDINE 20 MG PO TABS
20.0000 mg | ORAL_TABLET | Freq: Once | ORAL | Status: AC
  Administered 2018-04-15: 20 mg via ORAL
  Filled 2018-04-15: qty 1

## 2018-04-15 MED ORDER — ALUM & MAG HYDROXIDE-SIMETH 200-200-20 MG/5ML PO SUSP
30.0000 mL | Freq: Once | ORAL | Status: AC
  Administered 2018-04-15: 30 mL via ORAL
  Filled 2018-04-15: qty 30

## 2018-04-15 MED FILL — BANOPHEN 25 MG CAPSULE: 25 | 33 days supply | Qty: 100 | Fill #0

## 2018-04-15 MED FILL — FAMOTIDINE 20 MG TABLET: 20 | 5 days supply | Qty: 10 | Fill #0

## 2018-04-15 MED FILL — FLUTICASONE PROP 50 MCG SPR: 50 | 30 days supply | Qty: 16 | Fill #0

## 2018-04-15 NOTE — ED Triage Notes (Signed)
Pt reports general weakness, congestion since yesterday; c/o pinching feeling in LT shoulder since today; reports blurred vision since today

## 2018-04-15 NOTE — ED Notes (Signed)
Pt given can of sprite for po challenge per request.

## 2018-04-15 NOTE — ED Notes (Signed)
Pt/family verbalized understanding of discharge instructions.   

## 2018-04-15 NOTE — ED Notes (Signed)
Pt has felt weak since last night.  Denies chest pain or sob.  Pt states his stomach has been bothering him also.

## 2018-04-15 NOTE — ED Provider Notes (Signed)
Emergency Department Provider Note   I have reviewed the triage vital signs and the nursing notes.   HISTORY  Chief Complaint Weakness and Nausea   HPI [name] [name] is a 57 y.o. male with multiple medical problems are presents the emergency department today with weakness and nausea.  Patient states that he has a history of ulcers but no history of significant alcohol use, smoking, anti-inflammatories.  Does have a history of reflux for which he takes pantoprazole and high blood pressure.  He started having the nausea and bloated feeling yesterday.  He states he did have fried fish and fried oysters which may have contributed but then he also started having some runny nose and congestion in his frontal sinus area with some dripping down the back of his throat around the same time.  No history of allergies.  No fevers.  No bloody drainage.  Has been coughing with a drip and some.  Has little shortness of breath with a cough as well. No other associated or modifying symptoms.    Past Medical History:  Diagnosis Date  . Anxiety   . Depression   . Erectile dysfunction   . GERD (gastroesophageal reflux disease)   . Hyperlipidemia   . Hypertension   . Insomnia   . Panic   . Seasonal allergies     Patient Active Problem List   Diagnosis Date Noted  . Essential hypertension 09/01/2014  . Gastro-esophageal reflux 08/06/2014  . Major depression, recurrent, chronic ([place]) 12/19/2013  . Erectile dysfunction 12/13/2011  . Hyperlipidemia 12/13/2011  . Allergic rhinitis 12/13/2011    Past Surgical History:  Procedure Laterality Date  . HERNIA REPAIR      Current Outpatient Rx  . Order #: [id number] Class: Normal  . Order #: [id number] Class: Print  . Order #: [id number] Class: Print  . Order #: [id number] Class: Print  . Order #: [id number] Class: Historical Med  . Order #: [id number] Class: Normal  . Order #: [id number] Class: Normal    Allergies Patient has no known  allergies.  Family History  Problem Relation Age of Onset  . Hypertension Mother   . Prostate cancer Father   . Stroke Maternal Aunt     Social History Social History   Tobacco Use  . Smoking status: Never Smoker  . Smokeless tobacco: Never Used  Substance Use Topics  . Alcohol use: No    Alcohol/week: 0.0 standard drinks  . Drug use: No    Review of Systems  All other systems negative except as documented in the HPI. All pertinent positives and negatives as reviewed in the HPI. ____________________________________________   PHYSICAL EXAM:  VITAL SIGNS: ED Triage Vitals  Enc Vitals Group     BP 04/15/18 1035 (!) 146/97     Pulse Rate 04/15/18 1035 87     Resp 04/15/18 1035 18     Temp 04/15/18 1035 98.1 F (36.7 C)     Temp Source 04/15/18 1035 Oral     SpO2 04/15/18 1035 95 %     Weight 04/15/18 1036 180 lb (81.6 kg)     Height 04/15/18 1036 5\' 6"  (1.676 m)    Constitutional: Alert and oriented. Well appearing and in no acute distress. Eyes: Conjunctivae are normal. PERRL. EOMI. Head: Atraumatic. Frontal sinus ttp. Nose: No congestion/rhinnorhea. Mouth/Throat: Mucous membranes are moist.  Oropharynx non-erythematous. Neck: No stridor.  No meningeal signs.   Cardiovascular: Normal rate, regular rhythm. Good peripheral circulation. Grossly normal heart sounds.   Respiratory: Normal respiratory effort.  No retractions. Lungs CTAB. Gastrointestinal: Soft and nontender. No distention.  Musculoskeletal: No lower extremity tenderness nor edema. No gross deformities of extremities. Neurologic:  Normal speech and language. No gross focal neurologic deficits are appreciated.  Skin:  Skin is warm, dry and intact. No rash noted.   ____________________________________________   LABS (all labs ordered are listed, but only abnormal results are displayed)  Labs Reviewed  COMPREHENSIVE METABOLIC PANEL - Abnormal; Notable for the following components:      Result Value    Glucose, Bld 112 (*)    Calcium 8.6 (*)    All other components within normal limits  CBC WITH DIFFERENTIAL/PLATELET  LIPASE, BLOOD  TROPONIN I   ____________________________________________  EKG   EKG Interpretation  Date/Time:  Monday April 15 2018 10:30:05 EST Ventricular Rate:  86 PR Interval:    QRS Duration: 87 QT Interval:  362 QTC Calculation: 433 R Axis:   69 Text Interpretation:  Sinus rhythm No significant change since last tracing Confirmed by Merrily Pew [id number]) on 04/15/2018 10:50:14 AM       ____________________________________________  RADIOLOGY  Dg Chest 2 View  Result Date: 04/15/2018 CLINICAL DATA:  Cough and chest congestion. Shortness of breath beginning last night. EXAM: CHEST - 2 VIEW COMPARISON:  08/04/2014 FINDINGS: Artifact overlies the chest. Heart size is normal. Mediastinal shadows are normal. The lungs are clear. Bilateral nipple shadows are present. No effusions. No bone abnormalities. IMPRESSION: Normal chest Electronically Signed   By: Nelson Chimes M.D.   On: 04/15/2018 11:09    ____________________________________________   PROCEDURES  Procedure(s) performed:   Procedures   ____________________________________________   INITIAL IMPRESSION / ASSESSMENT AND PLAN / ED COURSE  Multiple possible etiologies however seems like the most likely is that he has indigestion with a sinus congestion.  Will treat symptomatically will rule out cardiac or pancreatic or hepatobiliary causes.  Symptoms significantly improved with treatment here.  Tolerating p.o.  Plan for DC on similar treatment and PCP follow-up.     Pertinent labs & imaging results that were available during my care of the patient were reviewed by me and considered in my medical decision making (see chart for details).  ____________________________________________  FINAL CLINICAL IMPRESSION(S) / ED DIAGNOSES  Final diagnoses:  Upper respiratory tract infection,  unspecified type  Nausea  Reflux esophagitis     MEDICATIONS GIVEN DURING THIS VISIT:  Medications  alum & mag hydroxide-simeth (MAALOX/MYLANTA) 200-200-20 MG/5ML suspension 30 mL (30 mLs Oral Given 04/15/18 1130)  lidocaine (XYLOCAINE) 2 % viscous mouth solution 15 mL (15 mLs Mouth/Throat Given 04/15/18 1130)  famotidine (PEPCID) tablet 20 mg (20 mg Oral Given 04/15/18 1129)     NEW OUTPATIENT MEDICATIONS STARTED DURING THIS VISIT:  Discharge Medication List as of 04/15/2018  2:48 PM    START taking these medications   Details  diphenhydrAMINE (BENADRYL) 25 MG tablet Take 1 tablet (25 mg total) by mouth every 8 (eight) hours as needed (runny nose)., Starting Mon 04/15/2018, Print    famotidine (PEPCID) 20 MG tablet Take 1 tablet (20 mg total) by mouth 2 (two) times daily., Starting Mon 04/15/2018, Print    fluticasone (FLONASE) 50 MCG/ACT nasal spray Place 2 sprays into both nostrils daily., Starting Mon 04/15/2018, Print        Note:  This note was prepared with assistance of Dragon voice recognition software. Occasional wrong-word or sound-a-like substitutions may have occurred due to the inherent limitations of voice recognition software.   Calissa Swenor, Corene Cornea, MD 04/15/18  1650  

## 2018-04-24 ENCOUNTER — Encounter (HOSPITAL_BASED_OUTPATIENT_CLINIC_OR_DEPARTMENT_OTHER): Payer: Medicare Other

## 2018-04-26 ENCOUNTER — Emergency Department (HOSPITAL_BASED_OUTPATIENT_CLINIC_OR_DEPARTMENT_OTHER)
Admission: EM | Admit: 2018-04-26 | Discharge: 2018-04-26 | Disposition: A | Discharge status: Home / Self Care | Payer: Medicare Other | Attending: Emergency Medicine | Admitting: Emergency Medicine

## 2018-04-26 ENCOUNTER — Emergency Department (HOSPITAL_BASED_OUTPATIENT_CLINIC_OR_DEPARTMENT_OTHER): Payer: Medicare Other

## 2018-04-26 ENCOUNTER — Encounter (HOSPITAL_BASED_OUTPATIENT_CLINIC_OR_DEPARTMENT_OTHER): Payer: Self-pay

## 2018-04-26 ENCOUNTER — Other Ambulatory Visit: Payer: Self-pay

## 2018-04-26 DIAGNOSIS — M79601 Pain in right arm: Secondary | ICD-10-CM | POA: Insufficient documentation

## 2018-04-26 DIAGNOSIS — R51 Headache: Secondary | ICD-10-CM | POA: Insufficient documentation

## 2018-04-26 DIAGNOSIS — R531 Weakness: Secondary | ICD-10-CM | POA: Insufficient documentation

## 2018-04-26 DIAGNOSIS — I10 Essential (primary) hypertension: Secondary | ICD-10-CM | POA: Insufficient documentation

## 2018-04-26 DIAGNOSIS — R0981 Nasal congestion: Secondary | ICD-10-CM | POA: Insufficient documentation

## 2018-04-26 DIAGNOSIS — R072 Precordial pain: Secondary | ICD-10-CM | POA: Diagnosis present

## 2018-04-26 DIAGNOSIS — R110 Nausea: Secondary | ICD-10-CM | POA: Diagnosis not present

## 2018-04-26 DIAGNOSIS — R1013 Epigastric pain: Secondary | ICD-10-CM | POA: Insufficient documentation

## 2018-04-26 LAB — I-STAT CHEM 8, ED
BUN: 9 mg/dL (ref 6–20)
Calcium, Ion: 1.09 mmol/L — ABNORMAL LOW (ref 1.15–1.40)
Chloride: 108 mmol/L (ref 98–111)
Creatinine, Ser: 1.1 mg/dL (ref 0.61–1.24)
GLUCOSE: 106 mg/dL — AB (ref 70–99)
HEMATOCRIT: 38 % — AB (ref 39.0–52.0)
HEMOGLOBIN: 12.9 g/dL — AB (ref 13.0–17.0)
POTASSIUM: 4.2 mmol/L (ref 3.5–5.1)
SODIUM: 142 mmol/L (ref 135–145)
TCO2: 27 mmol/L (ref 22–32)

## 2018-04-26 LAB — TROPONIN I

## 2018-04-26 LAB — CBC
HCT: 38.8 % — ABNORMAL LOW (ref 39.0–52.0)
HEMOGLOBIN: 12.8 g/dL — AB (ref 13.0–17.0)
MCH: 31.2 pg (ref 26.0–34.0)
MCHC: 33 g/dL (ref 30.0–36.0)
MCV: 94.6 fL (ref 80.0–100.0)
NRBC: 0 % (ref 0.0–0.2)
Platelets: 253 10*3/uL (ref 150–400)
RBC: 4.1 MIL/uL — AB (ref 4.22–5.81)
RDW: 12.7 % (ref 11.5–15.5)
WBC: 5.9 10*3/uL (ref 4.0–10.5)

## 2018-04-26 MED ORDER — DICYCLOMINE HCL 10 MG PO CAPS
10.0000 mg | ORAL_CAPSULE | Freq: Once | ORAL | Status: AC
  Administered 2018-04-26: 10 mg via ORAL
  Filled 2018-04-26: qty 1

## 2018-04-26 MED ORDER — AMOXICILLIN-POT CLAVULANATE 875-125 MG PO TABS: 1.0000 | ORAL_TABLET | Freq: Two times a day (BID) | ORAL | 0 refills | Status: AC

## 2018-04-26 MED ORDER — KETOROLAC TROMETHAMINE 30 MG/ML IJ SOLN
30.0000 mg | Freq: Once | INTRAMUSCULAR | Status: AC
  Administered 2018-04-26: 30 mg via INTRAVENOUS
  Filled 2018-04-26: qty 1

## 2018-04-26 MED ORDER — NAPROXEN 500 MG PO TABS: 500.0000 mg | ORAL_TABLET | Freq: Two times a day (BID) | ORAL | 0 refills | Status: DC

## 2018-04-26 MED FILL — NAPROXEN 500 MG TABLET: 500 | 7 days supply | Qty: 15 | Fill #0

## 2018-04-26 MED FILL — AMOX-CLAV 875-125 MG TABLET: 875-125 | 7 days supply | Qty: 14 | Fill #0

## 2018-04-26 NOTE — ED Notes (Signed)
Patient transported to X-ray 

## 2018-04-26 NOTE — Discharge Instructions (Signed)

## 2018-04-26 NOTE — ED Triage Notes (Signed)
C/o CP and right arm pain today-felt like he was going to pass out yesterday-pt with obvious nasal congestion-NAD-steady gait

## 2018-04-26 NOTE — ED Provider Notes (Signed)
Emergency Department Provider Note   I have reviewed the triage vital signs and the nursing notes.   HISTORY  Chief Complaint Chest Pain   HPI [name] [name] is a 57 y.o. male returns to the ED with multiple symptoms including face pressure/pain, congestion, weakness, and right arm/chest pain. Patient feels like drainage is running down the back of his throat and causing some associated nausea and abdominal pain. No fever or chills. Notes he was seen in the recent past for similar but not feeling much better. No vomiting or diarrhea. No fever. CP/right arm pain is constant and occasionally worse with movement but unchanged with exertion. No pleuritic pain.   Past Medical History:  Diagnosis Date  . Anxiety   . Depression   . Erectile dysfunction   . GERD (gastroesophageal reflux disease)   . Hyperlipidemia   . Hypertension   . Insomnia   . Panic   . Seasonal allergies     Patient Active Problem List   Diagnosis Date Noted  . Essential hypertension 09/01/2014  . Gastro-esophageal reflux 08/06/2014  . Major depression, recurrent, chronic ([place]) 12/19/2013  . Erectile dysfunction 12/13/2011  . Hyperlipidemia 12/13/2011  . Allergic rhinitis 12/13/2011    Past Surgical History:  Procedure Laterality Date  . HERNIA REPAIR     Allergies Patient has no known allergies.  Family History  Problem Relation Age of Onset  . Hypertension Mother   . Prostate cancer Father   . Stroke Maternal Aunt     Social History Social History   Tobacco Use  . Smoking status: Never Smoker  . Smokeless tobacco: Never Used  Substance Use Topics  . Alcohol use: Yes    Alcohol/week: 0.0 standard drinks    Comment: occ  . Drug use: No    Review of Systems  Constitutional: No fever/chills. Positive weakness.  Eyes: No visual changes. ENT: No sore throat. Positive face pain, congestion, and post-nasal drip.  Cardiovascular: Positive right lateral chest pain. Respiratory: Denies  shortness of breath. Gastrointestinal: Positive epigastric abdominal pain. Positive nausea, no vomiting.  No diarrhea.  No constipation. Genitourinary: Negative for dysuria. Musculoskeletal: Negative for back pain. Positive right arm pain.  Skin: Negative for rash. Neurological: Negative for headaches, focal weakness or numbness.  10-point ROS otherwise negative.  ____________________________________________   PHYSICAL EXAM:  VITAL SIGNS: ED Triage Vitals  Enc Vitals Group     BP 04/26/18 1152 (!) 157/101     Pulse Rate 04/26/18 1152 87     Resp 04/26/18 1152 18     Temp 04/26/18 1152 97.9 F (36.6 C)     Temp Source 04/26/18 1152 Oral     SpO2 04/26/18 1152 94 %     Pain Score 04/26/18 1151 8   Constitutional: Alert and oriented. Well appearing and in no acute distress. Eyes: Conjunctivae are normal.  Head: Atraumatic. Nose: Positive congestion/rhinnorhea. Positive maxillary sinus tenderness bilaterally.  Mouth/Throat: Mucous membranes are moist.  Oropharynx with mild erythema. No exudates. No PTA.  Neck: No stridor.  Cardiovascular: Normal rate, regular rhythm. Good peripheral circulation. Grossly normal heart sounds.   Respiratory: Normal respiratory effort.  No retractions. Lungs CTAB. Gastrointestinal: Soft and nontender. No distention.  Musculoskeletal: No lower extremity tenderness nor edema. No gross deformities of extremities. Neurologic:  Normal speech and language. No gross focal neurologic deficits are appreciated.  Skin:  Skin is warm, dry and intact. No rash noted.  ____________________________________________   LABS (all labs ordered are listed, but only  abnormal results are displayed)  Labs Reviewed  CBC - Abnormal; Notable for the following components:      Result Value   RBC 4.10 (*)    Hemoglobin 12.8 (*)    HCT 38.8 (*)    All other components within normal limits  I-STAT CHEM 8, ED - Abnormal; Notable for the following components:   Glucose,  Bld 106 (*)    Calcium, Ion 1.09 (*)    Hemoglobin 12.9 (*)    HCT 38.0 (*)    All other components within normal limits  TROPONIN I   ____________________________________________  EKG   EKG Interpretation  Date/Time:  Friday April 26 2018 11:58:30 EST Ventricular Rate:  86 PR Interval:    QRS Duration: 88 QT Interval:  376 QTC Calculation: 450 R Axis:   74 Text Interpretation:  Sinus rhythm Baseline wander in lead(s) I III aVL V2 V3 V4 V6 No STEMI.  Confirmed by Nanda Quinton [phone]) on 04/26/2018 12:01:30 PM       ____________________________________________  RADIOLOGY  Dg Chest 2 View  Result Date: 04/26/2018 CLINICAL DATA:  Right-sided chest pain. EXAM: CHEST - 2 VIEW COMPARISON:  04/15/2018.  08/04/2014. FINDINGS: Mediastinum hilar structures normal. Heart size normal. No focal infiltrate. 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 IMPRESSION: No acute cardiopulmonary disease. Electronically Signed   By: Marcello Moores  Register   On: 04/26/2018 12:24    ____________________________________________   PROCEDURES  Procedure(s) performed:   Procedures  None ____________________________________________   INITIAL IMPRESSION / ASSESSMENT AND PLAN / ED COURSE  Pertinent labs & imaging results that were available during my care of the patient were reviewed by me and considered in my medical decision making (see chart for details).  Patient with multiple complains discussed above. Chest pain appears to be MSK in origin. EKG and troponin are negative. Patient continues to have GERD symptoms. Face pain likely 2/2 sinus infection. Plan for Naproxen for chest pain and Augmentin for continued sinus symptoms. Advised close PCP follow up in the coming week with ED return plan if CP worsens.    ____________________________________________  FINAL CLINICAL IMPRESSION(S) / ED DIAGNOSES  Final diagnoses:  Precordial chest pain  Sinus  congestion     MEDICATIONS GIVEN DURING THIS VISIT:  Medications  dicyclomine (BENTYL) capsule 10 mg (10 mg Oral Given 04/26/18 1229)  ketorolac (TORADOL) 30 MG/ML injection 30 mg (30 mg Intravenous Given 04/26/18 1229)     NEW OUTPATIENT MEDICATIONS STARTED DURING THIS VISIT:  Discharge Medication List as of 04/26/2018  2:08 PM    START taking these medications   Details  amoxicillin-clavulanate (AUGMENTIN) 875-125 MG tablet Take 1 tablet by mouth every 12 (twelve) hours for 7 days., Starting Fri 04/26/2018, Until Fri 05/03/2018, Print    naproxen (NAPROSYN) 500 MG tablet Take 1 tablet (500 mg total) by mouth 2 (two) times daily., Starting Fri 04/26/2018, Print        Note:  This document was prepared using Dragon voice recognition software and may include unintentional dictation errors.  Nanda Quinton, MD Emergency Medicine    Graceyn Fodor, [facility], MD 04/26/18 [phone]

## 2018-05-17 ENCOUNTER — Encounter (HOSPITAL_BASED_OUTPATIENT_CLINIC_OR_DEPARTMENT_OTHER): Payer: Medicare Other

## 2018-06-03 ENCOUNTER — Encounter (HOSPITAL_BASED_OUTPATIENT_CLINIC_OR_DEPARTMENT_OTHER): Payer: Self-pay | Admitting: *Deleted

## 2018-06-03 ENCOUNTER — Other Ambulatory Visit: Payer: Self-pay

## 2018-06-03 ENCOUNTER — Emergency Department (HOSPITAL_BASED_OUTPATIENT_CLINIC_OR_DEPARTMENT_OTHER)
Admission: EM | Admit: 2018-06-03 | Discharge: 2018-06-03 | Disposition: A | Discharge status: Home / Self Care | Payer: Medicare Other | Attending: Emergency Medicine | Admitting: Emergency Medicine

## 2018-06-03 DIAGNOSIS — Z5321 Procedure and treatment not carried out due to patient leaving prior to being seen by health care provider: Secondary | ICD-10-CM | POA: Diagnosis not present

## 2018-06-03 DIAGNOSIS — R1012 Left upper quadrant pain: Secondary | ICD-10-CM | POA: Insufficient documentation

## 2018-06-03 DIAGNOSIS — R1032 Left lower quadrant pain: Secondary | ICD-10-CM | POA: Diagnosis not present

## 2018-06-03 DIAGNOSIS — R1110 Vomiting, unspecified: Secondary | ICD-10-CM | POA: Insufficient documentation

## 2018-06-03 NOTE — ED Triage Notes (Signed)
Left mid abdominal pain and vomiting. He was seen for same last week. He was also treated for a sinus infection and treated with a zpak that he will finish tomorrow.

## 2018-06-04 ENCOUNTER — Emergency Department (HOSPITAL_BASED_OUTPATIENT_CLINIC_OR_DEPARTMENT_OTHER)
Admission: EM | Admit: 2018-06-04 | Discharge: 2018-06-04 | Disposition: A | Discharge status: Home / Self Care | Payer: Medicare Other | Attending: Emergency Medicine | Admitting: Emergency Medicine

## 2018-06-04 ENCOUNTER — Other Ambulatory Visit: Payer: Self-pay

## 2018-06-04 ENCOUNTER — Encounter (HOSPITAL_BASED_OUTPATIENT_CLINIC_OR_DEPARTMENT_OTHER): Payer: Self-pay | Admitting: *Deleted

## 2018-06-04 ENCOUNTER — Emergency Department (HOSPITAL_BASED_OUTPATIENT_CLINIC_OR_DEPARTMENT_OTHER): Payer: Medicare Other

## 2018-06-04 DIAGNOSIS — R1032 Left lower quadrant pain: Secondary | ICD-10-CM | POA: Diagnosis not present

## 2018-06-04 DIAGNOSIS — Z79899 Other long term (current) drug therapy: Secondary | ICD-10-CM | POA: Diagnosis not present

## 2018-06-04 DIAGNOSIS — R1012 Left upper quadrant pain: Secondary | ICD-10-CM | POA: Diagnosis not present

## 2018-06-04 DIAGNOSIS — I10 Essential (primary) hypertension: Secondary | ICD-10-CM | POA: Diagnosis not present

## 2018-06-04 DIAGNOSIS — J329 Chronic sinusitis, unspecified: Secondary | ICD-10-CM

## 2018-06-04 DIAGNOSIS — R109 Unspecified abdominal pain: Secondary | ICD-10-CM

## 2018-06-04 LAB — COMPREHENSIVE METABOLIC PANEL
ALT: 32 U/L (ref 0–44)
AST: 24 U/L (ref 15–41)
Albumin: 4.7 g/dL (ref 3.5–5.0)
Alkaline Phosphatase: 66 U/L (ref 38–126)
Anion gap: 9 (ref 5–15)
BUN: 12 mg/dL (ref 6–20)
CO2: 21 mmol/L — ABNORMAL LOW (ref 22–32)
Calcium: 8.9 mg/dL (ref 8.9–10.3)
Chloride: 104 mmol/L (ref 98–111)
Creatinine, Ser: 1.03 mg/dL (ref 0.61–1.24)
GFR calc Af Amer: >60 mL/min (ref >60)
Glucose, Bld: 117 mg/dL — ABNORMAL HIGH (ref 70–99)
Potassium: 4.1 mmol/L (ref 3.5–5.1)
Sodium: 134 mmol/L — ABNORMAL LOW (ref 135–145)
Total Bilirubin: 0.9 mg/dL (ref 0.3–1.2)
Total Protein: 8.1 g/dL (ref 6.5–8.1)

## 2018-06-04 LAB — CBC
HCT: 41.9 % (ref 39.0–52.0)
Hemoglobin: 14.2 g/dL (ref 13.0–17.0)
MCH: 31.3 pg (ref 26.0–34.0)
MCHC: 33.9 g/dL (ref 30.0–36.0)
MCV: 92.5 fL (ref 80.0–100.0)
Platelets: 274 10*3/uL (ref 150–400)
RBC: 4.53 MIL/uL (ref 4.22–5.81)
RDW: 11.6 % (ref 11.5–15.5)
WBC: 7 10*3/uL (ref 4.0–10.5)
nRBC: 0 % (ref 0.0–0.2)

## 2018-06-04 LAB — LIPASE, BLOOD: LIPASE: 28 U/L (ref 11–51)

## 2018-06-04 LAB — URINALYSIS, ROUTINE W REFLEX MICROSCOPIC
Bilirubin Urine: NEGATIVE
GLUCOSE, UA: NEGATIVE mg/dL
Hgb urine dipstick: NEGATIVE
Ketones, ur: NEGATIVE mg/dL
Leukocytes, UA: NEGATIVE
Nitrite: NEGATIVE
Protein, ur: NEGATIVE mg/dL
Specific Gravity, Urine: <1.005 — ABNORMAL LOW (ref 1.005–1.030)
pH: 5.5 (ref 5.0–8.0)

## 2018-06-04 MED ORDER — AMOXICILLIN-POT CLAVULANATE 875-125 MG PO TABS: 1.0000 | ORAL_TABLET | Freq: Two times a day (BID) | ORAL | 0 refills | Status: DC

## 2018-06-04 MED ORDER — NAPROXEN 500 MG PO TABS: 500.0000 mg | ORAL_TABLET | Freq: Two times a day (BID) | ORAL | 0 refills | Status: DC

## 2018-06-04 MED ORDER — KETOROLAC TROMETHAMINE 30 MG/ML IJ SOLN
30.0000 mg | Freq: Once | INTRAMUSCULAR | Status: AC
  Administered 2018-06-04: 30 mg via INTRAVENOUS
  Filled 2018-06-04: qty 1

## 2018-06-04 MED ORDER — IBUPROFEN 400 MG PO TABS
400.0000 mg | ORAL_TABLET | Freq: Once | ORAL | Status: AC
  Administered 2018-06-04: 400 mg via ORAL
  Filled 2018-06-04: qty 1

## 2018-06-04 NOTE — ED Provider Notes (Signed)
[facility] EMERGENCY DEPARTMENT Provider Note   CSN: [id number] Arrival date & time: 06/04/18  1214   History   Chief Complaint Chief Complaint  Patient presents with  . Flank Pain    HPI [name] [name] is a 57 y.o. male.  HPI Patient presents to the emergency room with complaints of abdominal pain.  Patient states his symptoms started about a week ago.  The pains in the left flank and abdomen area.  Patient states he has had some intermittent vomiting as well as diarrhea.  He states he had symptoms last week but that that time he was having symptoms of a sinus infection as well and he was treated for that.  Patient states he followed up with his primary care doctor but was not given any additional treatment.  Patient's pain persist so he came to the ED for further evaluation.  He denies any fevers.  No dysuria. Past Medical History:  Diagnosis Date  . Anxiety   . Depression   . Erectile dysfunction   . GERD (gastroesophageal reflux disease)   . Hyperlipidemia   . Hypertension   . Insomnia   . Panic   . Seasonal allergies     Patient Active Problem List   Diagnosis Date Noted  . Essential hypertension 09/01/2014  . Gastro-esophageal reflux 08/06/2014  . Major depression, recurrent, chronic ([place]) 12/19/2013  . Erectile dysfunction 12/13/2011  . Hyperlipidemia 12/13/2011  . Allergic rhinitis 12/13/2011    Past Surgical History:  Procedure Laterality Date  . HERNIA REPAIR          Home Medications    Prior to Admission medications   Medication Sig Start Date End Date Taking? Authorizing Provider  atorvastatin (LIPITOR) 20 MG tablet Take 1 tablet (20 mg total) by mouth daily. 12/03/14   Lance Bosch, NP  diphenhydrAMINE (BENADRYL) 25 MG tablet Take 1 tablet (25 mg total) by mouth every 8 (eight) hours as needed (runny nose). 04/15/18   Mesner, Corene Cornea, MD  famotidine (PEPCID) 20 MG tablet Take 1 tablet (20 mg total) by mouth 2 (two) times daily.  04/15/18   Mesner, Corene Cornea, MD  fluticasone (FLONASE) 50 MCG/ACT nasal spray Place 2 sprays into both nostrils daily. 04/15/18   Mesner, Corene Cornea, MD  lisinopril-hydrochlorothiazide (PRINZIDE,ZESTORETIC) 20-12.5 MG tablet TK 1 T PO DAILY. START 03/05/2018 03/29/18   [provider]  mometasone (NASONEX) 50 MCG/ACT nasal spray Place 2 sprays into the nose daily as needed (congestion). 10/15/14   Lance Bosch, NP  naproxen (NAPROSYN) 500 MG tablet Take 1 tablet (500 mg total) by mouth 2 (two) times daily. 06/04/18   Dorie Rank, MD  pantoprazole (PROTONIX) 40 MG tablet Take 1 tablet (40 mg total) by mouth daily. 04/19/15   Lance Bosch, NP    Family History Family History  Problem Relation Age of Onset  . Hypertension Mother   . Prostate cancer Father   . Stroke Maternal Aunt     Social History Social History   Tobacco Use  . Smoking status: Never Smoker  . Smokeless tobacco: Never Used  Substance Use Topics  . Alcohol use: Yes    Alcohol/week: 0.0 standard drinks    Comment: occ  . Drug use: No     Allergies   Patient has no known allergies.   Review of Systems Review of Systems  All other systems reviewed and are negative.    Physical Exam Updated Vital Signs BP 137/89   Pulse 80  Temp 97.9 F (36.6 C) (Oral)   Resp 16   Ht 1.676 m (5\' 6" )   Wt 80.7 kg   SpO2 96%   BMI 28.72 kg/m   Physical Exam Vitals signs and nursing note reviewed.  Constitutional:      General: He is not in acute distress.    Appearance: He is well-developed.  HENT:     Head: Normocephalic and atraumatic.     Right Ear: External ear normal.     Left Ear: External ear normal.  Eyes:     General: No scleral icterus.       Right eye: No discharge.        Left eye: No discharge.     Conjunctiva/sclera: Conjunctivae normal.  Neck:     Musculoskeletal: Neck supple.     Trachea: No tracheal deviation.  Cardiovascular:     Rate and Rhythm: Normal rate and regular rhythm.    Pulmonary:     Effort: Pulmonary effort is normal. No respiratory distress.     Breath sounds: Normal breath sounds. No stridor. No wheezing or rales.  Abdominal:     General: Bowel sounds are normal. There is no distension.     Palpations: Abdomen is soft.     Tenderness: There is abdominal tenderness in the left upper quadrant and left lower quadrant. There is no guarding or rebound.  Musculoskeletal:        General: No tenderness.  Skin:    General: Skin is warm and dry.     Findings: No rash.  Neurological:     Mental Status: He is alert.     Cranial Nerves: No cranial nerve deficit (no facial droop, extraocular movements intact, no slurred speech).     Sensory: No sensory deficit.     Motor: No abnormal muscle tone or seizure activity.     Coordination: Coordination normal.      ED Treatments / Results  Labs (all labs ordered are listed, but only abnormal results are displayed) Labs Reviewed  COMPREHENSIVE METABOLIC PANEL - Abnormal; Notable for the following components:      Result Value   Sodium 134 (*)    CO2 21 (*)    Glucose, Bld 117 (*)    All other components within normal limits  CBC  LIPASE, BLOOD  URINALYSIS, ROUTINE W REFLEX MICROSCOPIC    EKG EKG Interpretation  Date/Time:  Tuesday June 04 2018 12:30:09 EST Ventricular Rate:  88 PR Interval:  148 QRS Duration: 84 QT Interval:  352 QTC Calculation: 425 R Axis:   65 Text Interpretation:  Normal sinus rhythm Normal ECG No significant change since last tracing Confirmed by Dorie Rank [phone]) on 06/04/2018 12:32:54 PM   Radiology Ct Abdomen Pelvis Wo Contrast  Result Date: 06/04/2018 CLINICAL DATA:  Left flank pain for 1.5 weeks. EXAM: CT ABDOMEN AND PELVIS WITHOUT CONTRAST TECHNIQUE: Multidetector CT imaging of the abdomen and pelvis was performed following the standard protocol without IV contrast. COMPARISON:  January 28, 2018 FINDINGS: Lower chest: No acute abnormality. Hepatobiliary: Mild  diffuse low density of the liver is identified in the right lobe liver. There is no focal mass identified. The gallbladder and biliary tree are normal. Pancreas: Unremarkable. No pancreatic ductal dilatation or surrounding inflammatory changes. Spleen: Normal in size without focal abnormality. Adrenals/Urinary Tract: Adrenal glands are unremarkable. Kidneys are normal, without renal calculi, focal lesion, or hydronephrosis. Bladder is unremarkable. Stomach/Bowel: Minimal hiatal hernia is noted. Stomach is otherwise within normal limits. Appendix  appears normal. No evidence of bowel wall thickening, distention, or inflammatory changes. Vascular/Lymphatic: Aortic atherosclerosis. No enlarged abdominal or pelvic lymph nodes. Reproductive: Prostate is unremarkable. Other: No abdominal wall hernia or abnormality. No abdominopelvic ascites. Musculoskeletal: No acute or significant osseous findings. IMPRESSION: No acute abnormality identified in the abdomen and pelvis. No nephrolithiasis or hydronephrosis is identified bilaterally. There is no bowel obstruction or diverticulitis. Electronically Signed   By: Abelardo Diesel M.D.   On: 06/04/2018 14:54    Procedures Procedures (including critical care time)  Medications Ordered in ED Medications  ibuprofen (ADVIL,MOTRIN) tablet 400 mg (400 mg Oral Given 06/04/18 1230)  ketorolac (TORADOL) 30 MG/ML injection 30 mg (30 mg Intravenous Given 06/04/18 1502)     Initial Impression / Assessment and Plan / ED Course  I have reviewed the triage vital signs and the nursing notes.  Pertinent labs & imaging results that were available during my care of the patient were reviewed by me and considered in my medical decision making (see chart for details).   Patient presented with persistent flank pain.  Patient's ED work-up is reassuring.  Laboratory tests are unremarkable.  CT scan does not show any acute pathology.  Urinalysis is pending and we will check to make sure  there are no signs of infection.  Symptoms may be musculoskeletal in nature.  At this time there does not appear to be any evidence of an acute emergency medical condition and the patient appears stable for discharge with appropriate outpatient follow up.   Final Clinical Impressions(s) / ED Diagnoses   Final diagnoses:  Flank pain    ED Discharge Orders         Ordered    naproxen (NAPROSYN) 500 MG tablet  2 times daily     06/04/18 1600           Dorie Rank, MD 06/04/18 [phone]

## 2018-06-04 NOTE — Discharge Instructions (Addendum)
Take the medications as needed for pain.  The blood tests and the CT scan did not show any abnormalities today.  No kidney stones or acute infection.  No signs of blockage or aneurysm.  Follow-up with your doctor if symptoms do not resolve.

## 2018-06-04 NOTE — ED Provider Notes (Signed)
UA without acute findings.  On reevaluation patient states he is also been dealing with a sinus infection for weeks now and is just completed a course of his second antibiotic which is azithromycin but he is feeling no better.  He is using nasal sprays and Mucinex.  He states he always has issues but is been really bad this year.  On exam patient has swollen nasal turbinates bilaterally and bilateral middle ear effusions.  Given his symptoms of been going on for more than 2 weeks we will give a 2-week course of Augmentin but stressed to him if things are not improving he needs to speak with his PCP about ENT referral.   Blanchie Dessert, MD 06/04/18 1721

## 2018-06-04 NOTE — ED Triage Notes (Signed)
Throbbing pain in his left mid abdomen. He left without being seen yesterday with same complaint. Pain is no better. He feels like he needs to have a BM but is unable.

## 2018-07-05 ENCOUNTER — Encounter (HOSPITAL_BASED_OUTPATIENT_CLINIC_OR_DEPARTMENT_OTHER): Payer: Medicare Other

## 2018-08-01 ENCOUNTER — Encounter (HOSPITAL_BASED_OUTPATIENT_CLINIC_OR_DEPARTMENT_OTHER): Payer: Medicare Other

## 2020-05-10 DIAGNOSIS — Z23 Encounter for immunization: Secondary | ICD-10-CM | POA: Diagnosis not present

## 2020-05-10 DIAGNOSIS — I10 Essential (primary) hypertension: Secondary | ICD-10-CM | POA: Diagnosis not present

## 2020-05-10 DIAGNOSIS — N182 Chronic kidney disease, stage 2 (mild): Secondary | ICD-10-CM | POA: Diagnosis not present

## 2020-05-10 DIAGNOSIS — E7800 Pure hypercholesterolemia, unspecified: Secondary | ICD-10-CM | POA: Diagnosis not present

## 2020-05-10 DIAGNOSIS — K219 Gastro-esophageal reflux disease without esophagitis: Secondary | ICD-10-CM | POA: Diagnosis not present

## 2020-05-14 DIAGNOSIS — E7800 Pure hypercholesterolemia, unspecified: Secondary | ICD-10-CM | POA: Diagnosis not present

## 2020-05-14 DIAGNOSIS — K219 Gastro-esophageal reflux disease without esophagitis: Secondary | ICD-10-CM | POA: Diagnosis not present

## 2020-05-14 DIAGNOSIS — I10 Essential (primary) hypertension: Secondary | ICD-10-CM | POA: Diagnosis not present

## 2020-05-14 DIAGNOSIS — N182 Chronic kidney disease, stage 2 (mild): Secondary | ICD-10-CM | POA: Diagnosis not present

## 2020-08-10 DIAGNOSIS — Z131 Encounter for screening for diabetes mellitus: Secondary | ICD-10-CM | POA: Diagnosis not present

## 2020-08-10 DIAGNOSIS — Z0001 Encounter for general adult medical examination with abnormal findings: Secondary | ICD-10-CM | POA: Diagnosis not present

## 2020-08-10 DIAGNOSIS — I10 Essential (primary) hypertension: Secondary | ICD-10-CM | POA: Diagnosis not present

## 2020-08-10 DIAGNOSIS — E7800 Pure hypercholesterolemia, unspecified: Secondary | ICD-10-CM | POA: Diagnosis not present

## 2020-08-10 DIAGNOSIS — K219 Gastro-esophageal reflux disease without esophagitis: Secondary | ICD-10-CM | POA: Diagnosis not present

## 2020-08-10 DIAGNOSIS — N182 Chronic kidney disease, stage 2 (mild): Secondary | ICD-10-CM | POA: Diagnosis not present

## 2020-08-17 DIAGNOSIS — K219 Gastro-esophageal reflux disease without esophagitis: Secondary | ICD-10-CM | POA: Diagnosis not present

## 2020-08-17 DIAGNOSIS — I10 Essential (primary) hypertension: Secondary | ICD-10-CM | POA: Diagnosis not present

## 2020-08-17 DIAGNOSIS — N182 Chronic kidney disease, stage 2 (mild): Secondary | ICD-10-CM | POA: Diagnosis not present

## 2020-08-17 DIAGNOSIS — E7800 Pure hypercholesterolemia, unspecified: Secondary | ICD-10-CM | POA: Diagnosis not present

## 2020-11-25 DIAGNOSIS — E782 Mixed hyperlipidemia: Secondary | ICD-10-CM | POA: Diagnosis not present

## 2020-11-25 DIAGNOSIS — N182 Chronic kidney disease, stage 2 (mild): Secondary | ICD-10-CM | POA: Diagnosis not present

## 2020-11-25 DIAGNOSIS — E7800 Pure hypercholesterolemia, unspecified: Secondary | ICD-10-CM | POA: Diagnosis not present

## 2020-11-25 DIAGNOSIS — K219 Gastro-esophageal reflux disease without esophagitis: Secondary | ICD-10-CM | POA: Diagnosis not present

## 2020-11-25 DIAGNOSIS — I10 Essential (primary) hypertension: Secondary | ICD-10-CM | POA: Diagnosis not present

## 2020-12-23 DIAGNOSIS — E782 Mixed hyperlipidemia: Secondary | ICD-10-CM | POA: Diagnosis not present

## 2020-12-23 DIAGNOSIS — K219 Gastro-esophageal reflux disease without esophagitis: Secondary | ICD-10-CM | POA: Diagnosis not present

## 2020-12-23 DIAGNOSIS — N182 Chronic kidney disease, stage 2 (mild): Secondary | ICD-10-CM | POA: Diagnosis not present

## 2020-12-23 DIAGNOSIS — I10 Essential (primary) hypertension: Secondary | ICD-10-CM | POA: Diagnosis not present

## 2021-02-08 DIAGNOSIS — E782 Mixed hyperlipidemia: Secondary | ICD-10-CM | POA: Diagnosis not present

## 2021-02-08 DIAGNOSIS — N182 Chronic kidney disease, stage 2 (mild): Secondary | ICD-10-CM | POA: Diagnosis not present

## 2021-02-08 DIAGNOSIS — K219 Gastro-esophageal reflux disease without esophagitis: Secondary | ICD-10-CM | POA: Diagnosis not present

## 2021-02-08 DIAGNOSIS — Z0001 Encounter for general adult medical examination with abnormal findings: Secondary | ICD-10-CM | POA: Diagnosis not present

## 2021-02-08 DIAGNOSIS — I10 Essential (primary) hypertension: Secondary | ICD-10-CM | POA: Diagnosis not present

## 2021-04-12 DIAGNOSIS — N182 Chronic kidney disease, stage 2 (mild): Secondary | ICD-10-CM | POA: Diagnosis not present

## 2021-04-12 DIAGNOSIS — J302 Other seasonal allergic rhinitis: Secondary | ICD-10-CM | POA: Diagnosis not present

## 2021-04-12 DIAGNOSIS — Z23 Encounter for immunization: Secondary | ICD-10-CM | POA: Diagnosis not present

## 2021-04-12 DIAGNOSIS — I10 Essential (primary) hypertension: Secondary | ICD-10-CM | POA: Diagnosis not present

## 2021-04-12 DIAGNOSIS — E782 Mixed hyperlipidemia: Secondary | ICD-10-CM | POA: Diagnosis not present

## 2021-04-12 DIAGNOSIS — K219 Gastro-esophageal reflux disease without esophagitis: Secondary | ICD-10-CM | POA: Diagnosis not present

## 2021-08-17 DIAGNOSIS — J302 Other seasonal allergic rhinitis: Secondary | ICD-10-CM | POA: Diagnosis not present

## 2021-08-17 DIAGNOSIS — K219 Gastro-esophageal reflux disease without esophagitis: Secondary | ICD-10-CM | POA: Diagnosis not present

## 2021-08-17 DIAGNOSIS — I10 Essential (primary) hypertension: Secondary | ICD-10-CM | POA: Diagnosis not present

## 2021-08-17 DIAGNOSIS — K5909 Other constipation: Secondary | ICD-10-CM | POA: Diagnosis not present

## 2021-08-17 DIAGNOSIS — N182 Chronic kidney disease, stage 2 (mild): Secondary | ICD-10-CM | POA: Diagnosis not present

## 2021-08-17 DIAGNOSIS — E782 Mixed hyperlipidemia: Secondary | ICD-10-CM | POA: Diagnosis not present

## 2021-08-24 DIAGNOSIS — J302 Other seasonal allergic rhinitis: Secondary | ICD-10-CM | POA: Diagnosis not present

## 2021-08-24 DIAGNOSIS — K5909 Other constipation: Secondary | ICD-10-CM | POA: Diagnosis not present

## 2021-08-24 DIAGNOSIS — I10 Essential (primary) hypertension: Secondary | ICD-10-CM | POA: Diagnosis not present

## 2021-08-24 DIAGNOSIS — E782 Mixed hyperlipidemia: Secondary | ICD-10-CM | POA: Diagnosis not present

## 2021-08-24 DIAGNOSIS — N182 Chronic kidney disease, stage 2 (mild): Secondary | ICD-10-CM | POA: Diagnosis not present

## 2021-08-24 DIAGNOSIS — K219 Gastro-esophageal reflux disease without esophagitis: Secondary | ICD-10-CM | POA: Diagnosis not present

## 2021-08-31 ENCOUNTER — Encounter: Payer: Self-pay | Admitting: Physician Assistant

## 2021-08-31 ENCOUNTER — Ambulatory Visit (INDEPENDENT_AMBULATORY_CARE_PROVIDER_SITE_OTHER): Payer: Medicare Other | Admitting: Physician Assistant

## 2021-08-31 VITALS — BP 132/78 | HR 76 | Ht 66.5 in | Wt 177.0 lb

## 2021-08-31 DIAGNOSIS — K625 Hemorrhage of anus and rectum: Secondary | ICD-10-CM

## 2021-08-31 DIAGNOSIS — K219 Gastro-esophageal reflux disease without esophagitis: Secondary | ICD-10-CM

## 2021-08-31 DIAGNOSIS — K5909 Other constipation: Secondary | ICD-10-CM

## 2021-08-31 DIAGNOSIS — R1013 Epigastric pain: Secondary | ICD-10-CM

## 2021-08-31 MED ORDER — NA SULFATE-K SULFATE-MG SULF 17.5-3.13-1.6 GM/177ML PO SOLN: 1.0000 | Freq: Once | ORAL | 0 refills | Status: AC

## 2021-08-31 NOTE — Patient Instructions (Signed)
Continue Linzess 72 mcg daily and pantoprazole 40 mg daily.  ? ?You have been scheduled for an endoscopy and colonoscopy. Please follow the written instructions given to you at your visit today. ?Please pick up your prep supplies at the pharmacy within the next 1-3 days. ?If you use inhalers (even only as needed), please bring them with you on the day of your procedure. ? ?The [facility] providers would like to encourage you to use [facility] to communicate with providers for non-urgent requests or questions.  Due to long hold times on the telephone, sending your provider a message by [facility] may be a faster and more efficient way to get a response.  Please allow 48 business hours for a response.  Please remember that this is for non-urgent requests.  ? ?Due to recent changes in healthcare laws, you may see the results of your imaging and laboratory studies on MyChart before your provider has had a chance to review them.  We understand that in some cases there may be results that are confusing or concerning to you. Not all laboratory results come back in the same time frame and the provider may be waiting for multiple results in order to interpret others.  Please give [place] 48 hours in order for your provider to thoroughly review all the results before contacting the office for clarification of your results.  ? ?

## 2021-08-31 NOTE — Progress Notes (Signed)
? ?Chief Complaint: Constipation and GERD ? ?HPI: ?   [name] [name] is a 61 year old African-American male with a past medical history as listed below including anxiety, depression and reflux, known to Dr. Fuller Plan, who was referred to me by [name] Drafts, FNP for a complaint of constipation and GERD. ?   02/2012 colonoscopy unremarkable except for medium sized internal hemorrhoids. ?   01/28/2015 patient seen in clinic by Dr. Fuller Plan for constipation and intermittent small-volume rectal bleeding.  MiraLAX was not effective.  At that time increase dietary fiber and water intake and given a trial of prune/prune juice daily.  Also discussed milk of magnesia once every other day to twice daily titrated for adequate bowel movements.  Given Preparation H OTC for hemorrhoid symptoms. ?   Today, the patient presents to clinic accompanied by his wife and tells me that just 3 to 4 days ago he started on Linzess 72 mcg and Pantoprazole 40 mg daily for a problem with constipation as well as reflux.  Tells me he has had chronic constipation for years and can never remember when it was any better and describes small amounts of stool on very few days of the week.  Also a lot of bloating and gas and seeing occasional blood in his stool when wiping.  Tells me after using the Linzess for the past 3 to 4 days it is may be helping slightly. ?   Also discusses constant reflux symptoms telling me that he constantly tastes acid in his mouth but denies any abdominal pain or dysphagia.  This has been going on for years as well.  He has not noticed a significant change with Pantoprazole 40 mg daily yet. ?   Denies fever, chills or weight loss. ? ?Past Medical History:  ?Diagnosis Date  ? Anxiety   ? Depression   ? Erectile dysfunction   ? GERD (gastroesophageal reflux disease)   ? Hyperlipidemia   ? Hypertension   ? Insomnia   ? Panic   ? Seasonal allergies   ? ? ?Past Surgical History:  ?Procedure Laterality Date  ? HERNIA REPAIR     ? ? ?Current Outpatient Medications  ?Medication Sig Dispense Refill  ? amLODipine (NORVASC) 5 MG tablet Take 5 mg by mouth daily.    ? atorvastatin (LIPITOR) 80 MG tablet Take 80 mg by mouth daily.    ? fenofibrate 54 MG tablet Take 1 tablet by mouth daily at 12 noon.    ? LINZESS 72 MCG capsule Take 72 mcg by mouth daily.    ? pantoprazole (PROTONIX) 40 MG tablet Take 1 tablet (40 mg total) by mouth daily. 30 tablet 3  ? Probiotic Product (DIGESTIVE ADVANTAGE) CAPS Take by mouth.    ? ?Current Facility-Administered Medications  ?Medication Dose Route Frequency Provider Last Rate Last Admin  ? testosterone cypionate (DEPOTESTOTERONE CYPIONATE) injection 200 mg  200 mg Intramuscular Q28 days Chari Manning A, NP   200 mg at 11/17/14 1512  ? ? ?Allergies as of 08/31/2021  ? (No Known Allergies)  ? ? ?Family History  ?Problem Relation Age of Onset  ? Hypertension Mother   ? Prostate cancer Father   ? Stroke Maternal Aunt   ? ? ?Social History  ? ?Socioeconomic History  ? Marital status: Single  ?  Spouse name: Not on file  ? Number of children: 2  ? Years of education: Not on file  ? Highest education level: Not on file  ?Occupational History  ? Occupation:  Disabled  ?Tobacco Use  ? Smoking status: Never  ? Smokeless tobacco: Never  ?Vaping Use  ? Vaping Use: Never used  ?Substance and Sexual Activity  ? Alcohol use: Yes  ?  Alcohol/week: 0.0 standard drinks  ?  Comment: occ  ? Drug use: No  ? Sexual activity: Not on file  ?Other Topics Concern  ? Not on file  ?Social History Narrative  ? Not on file  ? ?Social Determinants of Health  ? ?Financial Resource Strain: Not on file  ?Food Insecurity: Not on file  ?Transportation Needs: Not on file  ?Physical Activity: Not on file  ?Stress: Not on file  ?Social Connections: Not on file  ?Intimate Partner Violence: Not on file  ? ? ?Review of Systems:    ?Constitutional: No weight loss, fever or chills ?Skin: No rash  ?Cardiovascular: No chest pain   ?Respiratory: No SOB   ?Gastrointestinal: See HPI and otherwise negative ?Genitourinary: No dysuria or change in urinary frequency ?Neurological: No headache, dizziness or syncope ?Musculoskeletal: No new muscle or joint pain ?Hematologic: No bruising ?Psychiatric: No history of depression or anxiety ? ? Physical Exam:  ?Vital signs: ?BP 132/78   Pulse 76   Ht 5' 6.5" (1.689 m)   Wt 177 lb (80.3 kg)   BMI 28.14 kg/m?   ? ?Constitutional:   Pleasant AA male appears to be in NAD, Well developed, Well nourished, alert and cooperative ?Head:  Normocephalic and atraumatic. ?Eyes:   PEERL, EOMI. No icterus. Conjunctiva pink. ?Ears:  Normal auditory acuity. ?Neck:  Supple ?Throat: Oral cavity and pharynx without inflammation, swelling or lesion.  ?Respiratory: Respirations even and unlabored. Lungs clear to auscultation bilaterally.   No wheezes, crackles, or rhonchi.  ?Cardiovascular: Normal S1, S2. No MRG. Regular rate and rhythm. No peripheral edema, cyanosis or pallor.  ?Gastrointestinal:  Soft, nondistended, mild epigastric ttp, No rebound or guarding. Normal bowel sounds. No appreciable masses or hepatomegaly. ?Rectal:  Not performed.  ?Msk:  Symmetrical without gross deformities. Without edema, no deformity or joint abnormality.  ?Neurologic:  Alert and  oriented x4;  grossly normal neurologically.  ?Skin:   Dry and intact without significant lesions or rashes. ?Psychiatric:Demonstrates good judgement and reason without abnormal affect or behaviors. ? ?No recent labs/imaging. ? ?Assessment: ?1.  Chronic constipation: Worse over the past few months per patient, currently started on Linzess 72 mcg 3 to 4 days ago with no immediate change in symptoms; consider IBS vs other ?2.  Screening for colorectal cancer: Last colonoscopy in September 2013 with recommendations to repeat in 10 years ?3.  GERD: Symptoms for years, just started on Pantoprazole 40 mg qd 3 to 4 days ago; consider gastritis +/- H. Pylori ?4. Rectal Bleeding: Occasionally  when wiping after a hard bowel movement, previously diagnosed with hemorrhoids; likely hemorrhoids ?5. Epigastric pain: With reflux above ? ?Plan: ?1.  Discussed with patient that he is not technically due for screening for colon cancer until September of this year, but given his increasing constipation and bloating symptoms we will go ahead and add a diagnostic colonoscopy now. ?2.  Scheduled patient for diagnostic colonoscopy as well as diagnostic EGD in the [facility] with Dr. Fuller Plan.  Did provide the patient a detailed list of risks of procedures and he agrees to proceed. Patient is appropriate for endoscopic procedure(s) in the ambulatory ([facility]) setting.  ?3.  Recommend the patient continue his Linzess 72 mcg per day as he has only been on this for 3 to 4  days.  He should continue this at least for the next 3 to 4 weeks to see how his body will respond.  May need to be increased in the future. ?4.  Continue Pantoprazole 40 mg daily, 30-60 minutes before breakfast for reflux, again he is only on this medicine for 3 to 4 days, unsure how he will feel after the next couple of weeks, may need to be increased in the future. ?5.  Patient to follow in clinic per recommendations from Dr. Fuller Plan after time of procedures. ? ?Ellouise Newer, PA-C ?[facility] ?08/31/2021, 9:35 AM ? ?Cc: [name] Drafts, FNP  ?

## 2021-09-06 ENCOUNTER — Ambulatory Visit (AMBULATORY_SURGERY_CENTER): Payer: Medicare Other | Admitting: Gastroenterology

## 2021-09-06 ENCOUNTER — Encounter: Payer: Self-pay | Admitting: Gastroenterology

## 2021-09-06 VITALS — BP 127/77 | HR 72 | Temp 96.9°F | Resp 17 | Ht 66.5 in | Wt 177.0 lb

## 2021-09-06 DIAGNOSIS — K5730 Diverticulosis of large intestine without perforation or abscess without bleeding: Secondary | ICD-10-CM | POA: Diagnosis not present

## 2021-09-06 DIAGNOSIS — K922 Gastrointestinal hemorrhage, unspecified: Secondary | ICD-10-CM

## 2021-09-06 DIAGNOSIS — D123 Benign neoplasm of transverse colon: Secondary | ICD-10-CM

## 2021-09-06 DIAGNOSIS — R194 Change in bowel habit: Secondary | ICD-10-CM

## 2021-09-06 DIAGNOSIS — K219 Gastro-esophageal reflux disease without esophagitis: Secondary | ICD-10-CM

## 2021-09-06 DIAGNOSIS — R1013 Epigastric pain: Secondary | ICD-10-CM

## 2021-09-06 DIAGNOSIS — D124 Benign neoplasm of descending colon: Secondary | ICD-10-CM

## 2021-09-06 DIAGNOSIS — K648 Other hemorrhoids: Secondary | ICD-10-CM | POA: Diagnosis not present

## 2021-09-06 DIAGNOSIS — K317 Polyp of stomach and duodenum: Secondary | ICD-10-CM

## 2021-09-06 DIAGNOSIS — K921 Melena: Secondary | ICD-10-CM

## 2021-09-06 MED ORDER — SODIUM CHLORIDE 0.9 % IV SOLN: 500.0000 mL | Freq: Once | INTRAVENOUS | Status: DC

## 2021-09-06 NOTE — Op Note (Signed)
[facility] ?Patient Name: [name] [name] ?Procedure Date: 09/06/2021 8:32 AM ?MRN: [id number] ?Endoscopist: Ladene Artist , MD ?Age: 61 ?Referring MD:  ?Date of Birth: 08/05/1960 ?Gender: Male ?Account #: [id number] ?Procedure:                Upper GI endoscopy ?Indications:              Gastroesophageal reflux disease ?Medicines:                Monitored Anesthesia Care ?Procedure:                Pre-Anesthesia Assessment: ?                          - Prior to the procedure, a History and Physical  ?                          was performed, and patient medications and  ?                          allergies were reviewed. The patient's tolerance of  ?                          previous anesthesia was also reviewed. The risks  ?                          and benefits of the procedure and the sedation  ?                          options and risks were discussed with the patient.  ?                          All questions were answered, and informed consent  ?                          was obtained. Prior Anticoagulants: The patient has  ?                          taken no previous anticoagulant or antiplatelet  ?                          agents. ASA Grade Assessment: II - A patient with  ?                          mild systemic disease. After reviewing the risks  ?                          and benefits, the patient was deemed in  ?                          satisfactory condition to undergo the procedure. ?                          After obtaining informed consent, the endoscope was  ?  passed under direct vision. Throughout the  ?                          procedure, the patient's blood pressure, pulse, and  ?                          oxygen saturations were monitored continuously. The  ?                          Endoscope was introduced through the mouth, and  ?                          advanced to the second part of duodenum. The upper  ?                          GI endoscopy was  accomplished without difficulty.  ?                          The patient tolerated the procedure well. ?Scope In: ?Scope Out: ?Findings:                 The esophagus was normal. ?                          A single 10 mm sessile polyp with no bleeding and  ?                          no stigmata of recent bleeding was found in the  ?                          gastric fundus. The polyp was removed with a cold  ?                          snare. Resection and retrieval were complete. ?                          The exam of the stomach was otherwise normal. ?                          The examined duodenum was normal. ?                          The cardia and gastric fundus were normal on  ?                          retroflexion. ?Complications:            No immediate complications. ?Estimated Blood Loss:     Estimated blood loss: none. ?Impression:               - Normal esophagus. ?                          - A single gastric polyp. Resected and retrieved. ?                          -  Normal examined duodenum. ?Recommendation:           - Patient has a contact number available for  ?                          emergencies. The signs and symptoms of potential  ?                          delayed complications were discussed with the  ?                          patient. Return to normal activities tomorrow.  ?                          Written discharge instructions were provided to the  ?                          patient. ?                          - Resume previous diet. ?                          - Follow antireflux measures. ?                          - Continue present medications. ?                          - Await pathology results. ?                          - No aspirin, ibuprofen, naproxen, or other  ?                          non-steroidal anti-inflammatory drugs for 2 weeks  ?                          after polyp removal. ?Ladene Artist, MD ?09/06/2021 9:19:07 AM ?This report has been signed electronically. ?

## 2021-09-06 NOTE — Op Note (Signed)
[facility] ?Patient Name: [name] [name] ?Procedure Date: 09/06/2021 8:32 AM ?MRN: [id number] ?Endoscopist: Ladene Artist , MD ?Age: 61 ?Referring MD:  ?Date of Birth: 03/08/1961 ?Gender: Male ?Account #: [id number] ?Procedure:                Colonoscopy ?Indications:              Hematochezia, Change in bowel habits ?Medicines:                Monitored Anesthesia Care ?Procedure:                Pre-Anesthesia Assessment: ?                          - Prior to the procedure, a History and Physical  ?                          was performed, and patient medications and  ?                          allergies were reviewed. The patient's tolerance of  ?                          previous anesthesia was also reviewed. The risks  ?                          and benefits of the procedure and the sedation  ?                          options and risks were discussed with the patient.  ?                          All questions were answered, and informed consent  ?                          was obtained. Prior Anticoagulants: The patient has  ?                          taken no previous anticoagulant or antiplatelet  ?                          agents. ASA Grade Assessment: II - A patient with  ?                          mild systemic disease. After reviewing the risks  ?                          and benefits, the patient was deemed in  ?                          satisfactory condition to undergo the procedure. ?                          After obtaining informed consent, the colonoscope  ?  was passed under direct vision. Throughout the  ?                          procedure, the patient's blood pressure, pulse, and  ?                          oxygen saturations were monitored continuously. The  ?                          Olympus CF-[id number] ([id number]) Colonoscope was  ?                          introduced through the anus and advanced to the the  ?                          cecum, identified by  appendiceal orifice and  ?                          ileocecal valve. The ileocecal valve, appendiceal  ?                          orifice, and rectum were photographed. The quality  ?                          of the bowel preparation was good. The colonoscopy  ?                          was performed without difficulty. The patient  ?                          tolerated the procedure well. ?Scope In: 8:36:48 AM ?Scope Out: 8:53:29 AM ?Scope Withdrawal Time: 0 hours 15 minutes 3 seconds  ?Total Procedure Duration: 0 hours 16 minutes 41 seconds  ?Findings:                 The perianal and digital rectal examinations were  ?                          normal. ?                          Two sessile polyps were found in the descending  ?                          colon and transverse colon. The polyps were 7 mm in  ?                          size. These polyps were removed with a cold snare.  ?                          Resection and retrieval were complete. ?                          A few small-mouthed diverticula were found in the  ?  right colon. There was no evidence of diverticular  ?                          bleeding. ?                          Internal hemorrhoids were found during  ?                          retroflexion. The hemorrhoids were small and Grade  ?                          I (internal hemorrhoids that do not prolapse). ?                          The exam was otherwise without abnormality on  ?                          direct and retroflexion views. ?Complications:            No immediate complications. Estimated blood loss:  ?                          None. ?Estimated Blood Loss:     Estimated blood loss: none. ?Impression:               - Two sessile colon polyps. They were removed with  ?                          a cold snare. ?                          - Mild diverticulosis in the right colon. ?                          - Internal hemorrhoids. ?                          - The  examination was otherwise normal on direct  ?                          and retroflexion views. ?Recommendation:           - Repeat colonoscopy after studies are complete for  ?                          surveillance based on pathology results. ?                          - Patient has a contact number available for  ?                          emergencies. The signs and symptoms of potential  ?                          delayed complications were discussed with the  ?  patient. Return to normal activities tomorrow.  ?                          Written discharge instructions were provided to the  ?                          patient. ?                          - High fiber diet. ?                          - Continue present medications. ?                          - Await pathology results. ?Ladene Artist, MD ?09/06/2021 9:13:58 AM ?This report has been signed electronically. ?

## 2021-09-06 NOTE — Progress Notes (Signed)
A and O x3. Report to RN. Tolerated MAC anesthesia well.Teeth unchanged after procedure. 

## 2021-09-06 NOTE — Patient Instructions (Addendum)
NO ASPIRIN, ASPIRIN CONTAINING PRODUCTS (BC OR GOODY POWDERS) OR NSAIDS (IBUPROFEN, ADVIL, ALEVE, AND MOTRIN) FOR 2 weeks; TYLENOL IS OK TO TAKE  ? ?YOU HAD AN ENDOSCOPIC PROCEDURE TODAY AT [facility]:   Refer to the procedure report that was given to you for any specific questions about what was found during the examination.  If the procedure report does not answer your questions, please call your gastroenterologist to clarify.  If you requested that your care partner not be given the details of your procedure findings, then the procedure report has been included in a sealed envelope for you to review at your convenience later. ? ?YOU SHOULD EXPECT: Some feelings of bloating in the abdomen. Passage of more gas than usual.  Walking can help get rid of the air that was put into your GI tract during the procedure and reduce the bloating. If you had a lower endoscopy (such as a colonoscopy or flexible sigmoidoscopy) you may notice spotting of blood in your stool or on the toilet paper. If you underwent a bowel prep for your procedure, you may not have a normal bowel movement for a few days. ? ?Please Note:  You might notice some irritation and congestion in your nose or some drainage.  This is from the oxygen used during your procedure.  There is no need for concern and it should clear up in a day or so. ? ?SYMPTOMS TO REPORT IMMEDIATELY: ? ?Following lower endoscopy (colonoscopy or flexible sigmoidoscopy): ? Excessive amounts of blood in the stool ? Significant tenderness or worsening of abdominal pains ? Swelling of the abdomen that is new, acute ? Fever of 100?F or higher ? ?Following upper endoscopy (EGD) ? Vomiting of blood or coffee ground material ? New chest pain or pain under the shoulder blades ? Painful or persistently difficult swallowing ? New shortness of breath ? Fever of 100?F or higher ? Black, tarry-looking stools ? ?For urgent or emergent issues, a gastroenterologist can be reached  at any hour by calling [phone]. ?Do not use MyChart messaging for urgent concerns.  ? ? ?DIET:  We do recommend a small meal at first, but then you may proceed to your regular diet.  Drink plenty of fluids but you should avoid alcoholic beverages for 24 hours. ? ?ACTIVITY:  You should plan to take it easy for the rest of today and you should NOT DRIVE or use heavy machinery until tomorrow (because of the sedation medicines used during the test).   ? ?FOLLOW UP: ?Our staff will call the number listed on your records 48-72 hours following your procedure to check on you and address any questions or concerns that you may have regarding the information given to you following your procedure. If we do not reach you, we will leave a message.  We will attempt to reach you two times.  During this call, we will ask if you have developed any symptoms of COVID 19. If you develop any symptoms (ie: fever, flu-like symptoms, shortness of breath, cough etc.) before then, please call [phone].  If you test positive for Covid 19 in the 2 weeks post procedure, please call and report this information to [place].   ? ?If any biopsies were taken you will be contacted by phone or by letter within the next 1-3 weeks.  Please call us at [phone] if you have not heard about the biopsies in 3 weeks.  ? ? ?SIGNATURES/CONFIDENTIALITY: ?You and/or your care partner have  signed paperwork which will be entered into your electronic medical record.  These signatures attest to the fact that that the information above on your After Visit Summary has been reviewed and is understood.  Full responsibility of the confidentiality of this discharge information lies with you and/or your care-partner.  ?

## 2021-09-06 NOTE — Progress Notes (Signed)
See 08/31/2021 H&P, no changes.  ?

## 2021-09-06 NOTE — Progress Notes (Signed)
Called to room to assist during endoscopic procedure.  Patient ID and intended procedure confirmed with present staff. Received instructions for my participation in the procedure from the performing physician.  

## 2021-09-06 NOTE — Progress Notes (Signed)
VS completed by DT.    Medical history reviewed and updated.  

## 2021-09-08 ENCOUNTER — Telehealth: Payer: Self-pay | Admitting: *Deleted

## 2021-09-08 ENCOUNTER — Telehealth: Payer: Self-pay

## 2021-09-08 NOTE — Telephone Encounter (Signed)
Left message on follow up call. 

## 2021-09-08 NOTE — Telephone Encounter (Signed)
?  Follow up Call- ? ? ?  09/06/2021  ?  8:03 AM  ?Call back number  ?Post procedure Call Back phone  # [phone]  ?Permission to leave phone message Yes  ?  ? ?Patient questions: ? ?Do you have a fever, pain , or abdominal swelling? No. ?Pain Score  0 * ? ?Have you tolerated food without any problems? Yes.   ? ?Have you been able to return to your normal activities? Yes.   ? ?Do you have any questions about your discharge instructions: ?Diet   No. ?Medications  No. ?Follow up visit  No. ? ?Do you have questions or concerns about your Care? No. ? ?Actions: ?* If pain score is 4 or above: ?No action needed, pain <4. ? ? ?

## 2021-09-22 ENCOUNTER — Encounter: Payer: Self-pay | Admitting: Gastroenterology

## 2021-09-26 DIAGNOSIS — E782 Mixed hyperlipidemia: Secondary | ICD-10-CM | POA: Diagnosis not present

## 2021-09-26 DIAGNOSIS — I10 Essential (primary) hypertension: Secondary | ICD-10-CM | POA: Diagnosis not present

## 2021-09-26 DIAGNOSIS — R3589 Other polyuria: Secondary | ICD-10-CM | POA: Diagnosis not present

## 2021-09-26 DIAGNOSIS — N3281 Overactive bladder: Secondary | ICD-10-CM | POA: Diagnosis not present

## 2021-09-26 DIAGNOSIS — J302 Other seasonal allergic rhinitis: Secondary | ICD-10-CM | POA: Diagnosis not present

## 2021-09-26 DIAGNOSIS — K5909 Other constipation: Secondary | ICD-10-CM | POA: Diagnosis not present

## 2021-10-14 DIAGNOSIS — J309 Allergic rhinitis, unspecified: Secondary | ICD-10-CM | POA: Diagnosis not present

## 2021-10-14 DIAGNOSIS — Z0000 Encounter for general adult medical examination without abnormal findings: Secondary | ICD-10-CM | POA: Diagnosis not present

## 2021-10-14 DIAGNOSIS — K219 Gastro-esophageal reflux disease without esophagitis: Secondary | ICD-10-CM | POA: Diagnosis not present

## 2021-10-14 DIAGNOSIS — I10 Essential (primary) hypertension: Secondary | ICD-10-CM | POA: Diagnosis not present

## 2021-10-14 DIAGNOSIS — R7989 Other specified abnormal findings of blood chemistry: Secondary | ICD-10-CM | POA: Diagnosis not present

## 2021-10-14 DIAGNOSIS — E785 Hyperlipidemia, unspecified: Secondary | ICD-10-CM | POA: Diagnosis not present

## 2021-10-28 DIAGNOSIS — E559 Vitamin D deficiency, unspecified: Secondary | ICD-10-CM | POA: Diagnosis not present

## 2021-10-28 DIAGNOSIS — G4733 Obstructive sleep apnea (adult) (pediatric): Secondary | ICD-10-CM | POA: Diagnosis not present

## 2021-10-28 DIAGNOSIS — E782 Mixed hyperlipidemia: Secondary | ICD-10-CM | POA: Diagnosis not present

## 2021-10-28 DIAGNOSIS — Z79899 Other long term (current) drug therapy: Secondary | ICD-10-CM | POA: Diagnosis not present

## 2021-10-28 DIAGNOSIS — Z0001 Encounter for general adult medical examination with abnormal findings: Secondary | ICD-10-CM | POA: Diagnosis not present

## 2021-10-28 DIAGNOSIS — H6120 Impacted cerumen, unspecified ear: Secondary | ICD-10-CM | POA: Diagnosis not present

## 2021-10-28 DIAGNOSIS — I700 Atherosclerosis of aorta: Secondary | ICD-10-CM | POA: Diagnosis not present

## 2021-10-28 DIAGNOSIS — K5900 Constipation, unspecified: Secondary | ICD-10-CM | POA: Diagnosis not present

## 2021-11-01 ENCOUNTER — Other Ambulatory Visit: Payer: Self-pay | Admitting: Pharmacist

## 2021-11-01 NOTE — Chronic Care Management (AMB) (Signed)
Patient seen by Park Liter, PharmD Candidate on 10/28/21 while they were picking up prescriptions at [facility].   Patient does not have an automated home blood pressure machine.   Medication review was performed. They are taking medications as prescribed.   The following barriers to adherence were noted: - Questions/Concerns about medications - does report some periodic dizziness/lightheadedness   The following interventions were completed:  - Medications were reviewed - Patient was educated on medications, including indication and administration - Patient was educated on how to access home blood pressure machine - discussed collaborating with PCP to order BP machine under Medicaid.   The patient does not have follow up scheduled, but was encouraged to call and make appointment to discussed elevated BP as well as lightheadedness/dizziness. He verbalized understanding   Catie Hedwig Morton, PharmD, [facility] [phone]

## 2023-12-21 ENCOUNTER — Encounter: Payer: Self-pay | Admitting: Gastroenterology

## 2024-02-05 NOTE — Telephone Encounter (Signed)
 Will need OV for further refills. Will send in 3 month supply of both famotidine  and pantoprazole 

## 2024-02-14 ENCOUNTER — Ambulatory Visit: Admitting: Gastroenterology

## 2024-02-14 ENCOUNTER — Encounter: Payer: Self-pay | Admitting: Gastroenterology

## 2024-02-14 ENCOUNTER — Ambulatory Visit (INDEPENDENT_AMBULATORY_CARE_PROVIDER_SITE_OTHER)
Admission: RE | Admit: 2024-02-14 | Discharge: 2024-02-14 | Disposition: A | Discharge status: Home / Self Care | Source: Ambulatory Visit | Attending: Gastroenterology

## 2024-02-14 ENCOUNTER — Ambulatory Visit: Payer: Self-pay | Admitting: Gastroenterology

## 2024-02-14 VITALS — BP 136/80 | HR 87 | Ht 66.5 in | Wt 180.0 lb

## 2024-02-14 DIAGNOSIS — K5909 Other constipation: Secondary | ICD-10-CM | POA: Diagnosis not present

## 2024-02-14 DIAGNOSIS — Z800 Family history of malignant neoplasm of digestive organs: Secondary | ICD-10-CM

## 2024-02-14 DIAGNOSIS — K5900 Constipation, unspecified: Secondary | ICD-10-CM | POA: Diagnosis not present

## 2024-02-14 DIAGNOSIS — K219 Gastro-esophageal reflux disease without esophagitis: Secondary | ICD-10-CM

## 2024-02-14 DIAGNOSIS — Z860101 Personal history of adenomatous and serrated colon polyps: Secondary | ICD-10-CM

## 2024-02-14 MED ORDER — PANTOPRAZOLE SODIUM 40 MG PO TBEC: 40.0000 mg | DELAYED_RELEASE_TABLET | Freq: Two times a day (BID) | ORAL | 0 refills | Status: AC

## 2024-02-14 NOTE — Progress Notes (Signed)
 Chief Complaint:GERD Primary GI Doctor: (previously Dr. Aneita) Dr. Charlanne  HPI:  Patient is a  63  year old male patient with past medical history of HTN, GERD, depression, and chronic constipation, who was referred to me by Catalina Bare, MD on 12/18/23 for a evaluation of GERD .   Patient last seen in the GI office on 08/31/2021 by Delon, PA for constipation and GERD.  Interval History    Patient presents for evaluation of GERD and chronic constipation accompanied by his wife. Patient has history of GERD and currently taking pantoprazole  40 mg po daily. He has regurgitation at night when lying flat several days a week. He reports he has had this issues since 1990's and he was initially placed on Nexium.  Denies dysphagia.   Patient has history of chronic idiopathic constipation and currently taking Linzess 72 mcg p.o. daily. Patient complains of severe bloating and pushes up on his diagram.  Patient has bowel movement most days but is small amount. No blood in stool.   Nonsmoker. He drinks on special occasions.  Surgical history: hernia repair   Patient's family history : father with colon CA in 86s   Wt Readings from Last 3 Encounters:  02/14/24 180 lb (81.6 kg)  09/06/21 177 lb (80.3 kg)  08/31/21 177 lb (80.3 kg)     Past Medical History:  Diagnosis Date   Anxiety    Depression    Erectile dysfunction    GERD (gastroesophageal reflux disease)    Hyperlipidemia    Hypertension    Insomnia    Panic    Seasonal allergies     Past Surgical History:  Procedure Laterality Date   COLONOSCOPY     HERNIA REPAIR      Current Outpatient Medications  Medication Sig Dispense Refill   amLODipine (NORVASC) 5 MG tablet Take 5 mg by mouth daily.     atorvastatin  (LIPITOR) 80 MG tablet Take 80 mg by mouth daily.     fenofibrate 54 MG tablet Take 1 tablet by mouth daily at 12 noon.     LINZESS 72 MCG capsule Take 72 mcg by mouth daily.     pantoprazole  (PROTONIX ) 40 MG  tablet Take 1 tablet (40 mg total) by mouth daily. 30 tablet 3   Probiotic Product (DIGESTIVE ADVANTAGE) CAPS Take by mouth.     Current Facility-Administered Medications  Medication Dose Route Frequency Provider Last Rate Last Admin   testosterone  cypionate (DEPOTESTOTERONE CYPIONATE) injection 200 mg  200 mg Intramuscular Q28 days Oleta Kins A, NP   200 mg at 11/17/14 1512    Allergies as of 02/14/2024   (No Known Allergies)    Family History  Problem Relation Age of Onset   Hypertension Mother    Prostate cancer Father    Stroke Maternal Aunt    Colon cancer Neg Hx    Esophageal cancer Neg Hx    Stomach cancer Neg Hx    Rectal cancer Neg Hx     Review of Systems:    Constitutional: No weight loss, fever, chills, weakness or fatigue HEENT: Eyes: No change in vision               Ears, Nose, Throat:  No change in hearing or congestion Skin: No rash or itching Cardiovascular: No chest pain, chest pressure or palpitations   Respiratory: No SOB or cough Gastrointestinal: See HPI and otherwise negative Genitourinary: No dysuria or change in urinary frequency Neurological: No headache, dizziness or syncope  Musculoskeletal: No new muscle or joint pain Hematologic: No bleeding or bruising Psychiatric: No history of depression or anxiety    Physical Exam:  Vital signs: BP 136/80   Pulse 87   Ht 5' 6.5 (1.689 m)   Wt 180 lb (81.6 kg)   BMI 28.62 kg/m   Constitutional:   Pleasant male appears to be in NAD, Well developed, Well nourished, alert and cooperative Throat: Oral cavity and pharynx without inflammation, swelling or lesion.  Respiratory: Respirations even and unlabored. Lungs clear to auscultation bilaterally.   No wheezes, crackles, or rhonchi.  Cardiovascular: Normal S1, S2. Regular rate and rhythm. No peripheral edema, cyanosis or pallor.  Gastrointestinal:  Soft, distended, nontender. No rebound or guarding. Hypoactive bowel sounds. No appreciable masses or  hepatomegaly. Rectal:  Not performed.  Msk:  Symmetrical without gross deformities. Without edema, no deformity or joint abnormality.  Neurologic:  Alert and  oriented x4;  grossly normal neurologically.  Skin:   Dry and intact without significant lesions or rashes.  RELEVANT LABS AND IMAGING: CBC    Latest Ref Rng & Units 06/04/2018    3:00 PM 04/26/2018   12:30 PM 04/26/2018   12:04 PM  CBC  WBC 4.0 - 10.5 K/uL 7.0   5.9   Hemoglobin 13.0 - 17.0 g/dL 85.7  87.0  87.1   Hematocrit 39.0 - 52.0 % 41.9  38.0  38.8   Platelets 150 - 400 K/uL 274   253      CMP     Latest Ref Rng & Units 06/04/2018    3:00 PM 04/26/2018   12:30 PM 04/15/2018   11:19 AM  CMP  Glucose 70 - 99 mg/dL 882  893  887   BUN 6 - 20 mg/dL 12  9  12    Creatinine 0.61 - 1.24 mg/dL 8.96  8.89  8.89   Sodium 135 - 145 mmol/L 134  142  138   Potassium 3.5 - 5.1 mmol/L 4.1  4.2  3.5   Chloride 98 - 111 mmol/L 104  108  106   CO2 22 - 32 mmol/L 21   22   Calcium  8.9 - 10.3 mg/dL 8.9   8.6   Total Protein 6.5 - 8.1 g/dL 8.1   7.5   Total Bilirubin 0.3 - 1.2 mg/dL 0.9   0.8   Alkaline Phos 38 - 126 U/L 66   68   AST 15 - 41 U/L 24   40   ALT 0 - 44 U/L 32   40      Lab Results  Component Value Date   TSH 0.464 09/01/2014    09/2021 EGD - Normal esophagus.  - A single gastric polyp. Resected and retrieved. - Normal examined duodenum. Path: Surgical [P], gastric fundus polyp, polyp (1) - FUNDIC GLAND POLYP(S) - NO H. PYLORI, INTESTINAL METAPLASIA OR MALIGNANCY IDENTIFIED  09/2021 colonoscopy - Two sessile colon polyps. They were removed with a cold snare. Mild diverticulosis in the right colon. - Internal hemorrhoids. - The examination was otherwise normal on direct and retroflexion views. Path: 1. Surgical [P], colon, descending and transverse, polyp (2) - TUBULAR ADENOMA (1 FRAGMENTS) - MULTIPLE FRAGMENTS OF BENIGN COLONIC MUCOSA - NO HIGH-GRADE DYSPLASIA OR MALIGNANCY IDENTIFIED  09/2014  EGD Gastritis in the gastric body and antrum The EGD was otherwise normal   Assessment/Plan: 63 year old male patient who presents with chronic GERD and chronic constipation. #1 GERD EGD 09/2021 neg - Increase Pantoprazole  40 mg po daily  from once daily to twice daily -Recommend GERD diet, no late meals  #2 Chronic constipation -Increase Linzess form 72 mcg to 145mcg po daily, provide samples - Recommend high fiber diet - abdominal xray 2 view , distended on physical exam #3  History of tubular adenoma $4 history of colon cancer in father -Patient due for colon screening colonoscopy in 09/2026   Thank you for the courtesy of this consult. Please call me with any questions or concerns.   Jadene Stemmer, FNP-C [facility] 02/14/2024, 11:34 AM  Cc: Catalina Bare, MD

## 2024-02-14 NOTE — Patient Instructions (Addendum)
 GERD Recommend GERD diet Pantoprazole  40 mg twice daily   Constipation  Recommend high fiber diet Linzess 145 mcg po daily    Your provider has requested that you have an abdominal x ray before leaving today. Please go to the basement floor to our Radiology department for the test.  _______________________________________________________  If your blood pressure at your visit was 140/90 or greater, please contact your primary care physician to follow up on this.  _______________________________________________________  If you are age 97 or older, your body mass index should be between 23-30. Your Body mass index is 28.62 kg/m. If this is out of the aforementioned range listed, please consider follow up with your Primary Care Provider.  If you are age 19 or younger, your body mass index should be between 19-25. Your Body mass index is 28.62 kg/m. If this is out of the aformentioned range listed, please consider follow up with your Primary Care Provider.   ________________________________________________________  The [facility] providers would like to encourage you to use MYCHART to communicate with providers for non-urgent requests or questions.  Due to long hold times on the telephone, sending your provider a message by [facility] may be a faster and more efficient way to get a response.  Please allow 48 business hours for a response.  Please remember that this is for non-urgent requests.  _______________________________________________________  [facility] is using a team-based approach to care.  Your team is made up of your doctor and two to three APPS. Our APPS (Nurse Practitioners and Physician Assistants) work with your physician to ensure care continuity for you. They are fully qualified to address your health concerns and develop a treatment plan. They communicate directly with your gastroenterologist to care for you. Seeing the Advanced Practice Practitioners on your  physician's team can help you by facilitating care more promptly, often allowing for earlier appointments, access to diagnostic testing, procedures, and other specialty referrals.   Thank you for trusting me with your gastrointestinal care. Deanna May, FNP-C
